# Patient Record
Sex: Female | Born: 1965 | Race: Black or African American | Hispanic: No | Smoking: Never smoker
Health system: Southern US, Community
[De-identification: ages and names within clinical notes are randomized; demographics above are authoritative.]

## PROBLEM LIST (undated history)

## (undated) ENCOUNTER — Emergency Department (HOSPITAL_COMMUNITY): Discharge status: Absent without leave | Payer: Self-pay

## (undated) DIAGNOSIS — D509 Iron deficiency anemia, unspecified: Secondary | ICD-10-CM

## (undated) HISTORY — PX: TUBAL LIGATION: SHX77

## (undated) HISTORY — PX: SMALL INTESTINE SURGERY: SHX150

## (undated) HISTORY — DX: Iron deficiency anemia, unspecified: D50.9

---

## 1999-01-18 ENCOUNTER — Inpatient Hospital Stay (HOSPITAL_COMMUNITY): Admission: AD | Admit: 1999-01-18 | Discharge: 1999-01-18 | Payer: Self-pay | Admitting: *Deleted

## 1999-03-19 ENCOUNTER — Inpatient Hospital Stay (HOSPITAL_COMMUNITY): Admission: AD | Admit: 1999-03-19 | Discharge: 1999-03-19 | Payer: Self-pay | Admitting: Obstetrics and Gynecology

## 1999-03-19 ENCOUNTER — Inpatient Hospital Stay (HOSPITAL_COMMUNITY): Admission: AD | Admit: 1999-03-19 | Discharge: 1999-03-21 | Payer: Self-pay | Admitting: Obstetrics and Gynecology

## 2003-12-03 HISTORY — PX: EXCISION OF TONGUE LESION: SHX6434

## 2010-12-18 ENCOUNTER — Emergency Department (HOSPITAL_COMMUNITY)
Admission: EM | Admit: 2010-12-18 | Discharge: 2010-12-18 | Payer: Self-pay | Source: Home / Self Care | Admitting: Family Medicine

## 2010-12-20 ENCOUNTER — Emergency Department (HOSPITAL_COMMUNITY)
Admission: EM | Admit: 2010-12-20 | Discharge: 2010-12-20 | Payer: Self-pay | Source: Home / Self Care | Admitting: Family Medicine

## 2015-10-16 ENCOUNTER — Emergency Department (HOSPITAL_BASED_OUTPATIENT_CLINIC_OR_DEPARTMENT_OTHER)
Admission: EM | Admit: 2015-10-16 | Discharge: 2015-10-16 | Disposition: A | Discharge status: Home / Self Care | Payer: Worker's Compensation | Attending: Emergency Medicine | Admitting: Emergency Medicine

## 2015-10-16 ENCOUNTER — Encounter (HOSPITAL_BASED_OUTPATIENT_CLINIC_OR_DEPARTMENT_OTHER): Payer: Self-pay | Admitting: Emergency Medicine

## 2015-10-16 DIAGNOSIS — Y92811 Bus as the place of occurrence of the external cause: Secondary | ICD-10-CM | POA: Diagnosis not present

## 2015-10-16 DIAGNOSIS — Y288XXA Contact with other sharp object, undetermined intent, initial encounter: Secondary | ICD-10-CM | POA: Insufficient documentation

## 2015-10-16 DIAGNOSIS — Z23 Encounter for immunization: Secondary | ICD-10-CM | POA: Insufficient documentation

## 2015-10-16 DIAGNOSIS — Y998 Other external cause status: Secondary | ICD-10-CM | POA: Insufficient documentation

## 2015-10-16 DIAGNOSIS — Y9389 Activity, other specified: Secondary | ICD-10-CM | POA: Insufficient documentation

## 2015-10-16 DIAGNOSIS — S80812A Abrasion, left lower leg, initial encounter: Secondary | ICD-10-CM | POA: Insufficient documentation

## 2015-10-16 DIAGNOSIS — T148XXA Other injury of unspecified body region, initial encounter: Secondary | ICD-10-CM

## 2015-10-16 MED ORDER — TETANUS-DIPHTH-ACELL PERTUSSIS 5-2.5-18.5 LF-MCG/0.5 IM SUSP
0.5000 mL | Freq: Once | INTRAMUSCULAR | Status: AC
  Administered 2015-10-16: 0.5 mL via INTRAMUSCULAR
  Filled 2015-10-16: qty 0.5

## 2015-10-16 NOTE — Discharge Instructions (Signed)
Continue wound care as we discussed. Forced wound daily with soap and water. Apply Neosporin or Polysporin ointment 2-3 times a day. Return for any developments of infection.
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
## 2015-11-19 ENCOUNTER — Encounter (HOSPITAL_BASED_OUTPATIENT_CLINIC_OR_DEPARTMENT_OTHER): Payer: Self-pay | Admitting: Emergency Medicine

## 2015-11-19 ENCOUNTER — Emergency Department (HOSPITAL_BASED_OUTPATIENT_CLINIC_OR_DEPARTMENT_OTHER)
Admission: EM | Admit: 2015-11-19 | Discharge: 2015-11-19 | Disposition: A | Discharge status: Home / Self Care | Payer: No Typology Code available for payment source | Attending: Emergency Medicine | Admitting: Emergency Medicine

## 2015-11-19 DIAGNOSIS — R110 Nausea: Secondary | ICD-10-CM | POA: Diagnosis present

## 2015-11-19 DIAGNOSIS — K529 Noninfective gastroenteritis and colitis, unspecified: Secondary | ICD-10-CM | POA: Diagnosis not present

## 2015-11-19 LAB — CBC WITH DIFFERENTIAL/PLATELET
Basophils Absolute: 0 10*3/uL (ref 0.0–0.1)
Basophils Relative: 0 %
Eosinophils Absolute: 0.1 10*3/uL (ref 0.0–0.7)
Eosinophils Relative: 2 %
HCT: 34.7 % — ABNORMAL LOW (ref 36.0–46.0)
Hemoglobin: 11.5 g/dL — ABNORMAL LOW (ref 12.0–15.0)
Lymphocytes Relative: 14 %
Lymphs Abs: 0.7 10*3/uL (ref 0.7–4.0)
MCH: 28.6 pg (ref 26.0–34.0)
MCHC: 33.1 g/dL (ref 30.0–36.0)
MCV: 86.3 fL (ref 78.0–100.0)
Monocytes Absolute: 0.3 10*3/uL (ref 0.1–1.0)
Monocytes Relative: 5 %
Neutro Abs: 3.9 10*3/uL (ref 1.7–7.7)
Neutrophils Relative %: 79 %
Platelets: 183 10*3/uL (ref 150–400)
RBC: 4.02 MIL/uL (ref 3.87–5.11)
RDW: 12.9 % (ref 11.5–15.5)
WBC: 5 10*3/uL (ref 4.0–10.5)

## 2015-11-19 LAB — BASIC METABOLIC PANEL
Anion gap: 9 (ref 5–15)
BUN: 7 mg/dL (ref 6–20)
CO2: 23 mmol/L (ref 22–32)
Calcium: 8.4 mg/dL — ABNORMAL LOW (ref 8.9–10.3)
Chloride: 105 mmol/L (ref 101–111)
Creatinine, Ser: 0.57 mg/dL (ref 0.44–1.00)
GFR calc Af Amer: >60 mL/min (ref >60)
GFR calc non Af Amer: >60 mL/min (ref >60)
Glucose, Bld: 86 mg/dL (ref 65–99)
Potassium: 3.1 mmol/L — ABNORMAL LOW (ref 3.5–5.1)
Sodium: 137 mmol/L (ref 135–145)

## 2015-11-19 MED ORDER — POTASSIUM CHLORIDE ER 10 MEQ PO TBCR: 10.0000 meq | EXTENDED_RELEASE_TABLET | Freq: Every day | ORAL | Status: DC

## 2015-11-19 MED ORDER — SODIUM CHLORIDE 0.9 % IV BOLUS (SEPSIS)
1000.0000 mL | Freq: Once | INTRAVENOUS | Status: AC
  Administered 2015-11-19: 1000 mL via INTRAVENOUS

## 2015-11-19 NOTE — Discharge Instructions (Signed)
Food Choices to Help Relieve Diarrhea, Adult °When you have diarrhea, the foods you eat and your eating habits are very important. Choosing the right foods and drinks can help relieve diarrhea. Also, because diarrhea can last up to 7 days, you need to replace lost fluids and electrolytes (such as sodium, potassium, and chloride) in order to help prevent dehydration.  °WHAT GENERAL GUIDELINES DO I NEED TO FOLLOW? °· Slowly drink 1 cup (8 oz) of fluid for each episode of diarrhea. If you are getting enough fluid, your urine will be clear or pale yellow. °· Eat starchy foods. Some good choices include white rice, white toast, pasta, low-fiber cereal, baked potatoes (without the skin), saltine crackers, and bagels. °· Avoid large servings of any cooked vegetables. °· Limit fruit to two servings per day. A serving is ½ cup or 1 small piece. °· Choose foods with less than 2 g of fiber per serving. °· Limit fats to less than 8 tsp (38 g) per day. °· Avoid fried foods. °· Eat foods that have probiotics in them. Probiotics can be found in certain dairy products. °· Avoid foods and beverages that may increase the speed at which food moves through the stomach and intestines (gastrointestinal tract). Things to avoid include: °¨ High-fiber foods, such as dried fruit, raw fruits and vegetables, nuts, seeds, and whole grain foods. °¨ Spicy foods and high-fat foods. °¨ Foods and beverages sweetened with high-fructose corn syrup, honey, or sugar alcohols such as xylitol, sorbitol, and mannitol. °WHAT FOODS ARE RECOMMENDED? °Grains °White rice. White, French, or pita breads (fresh or toasted), including plain rolls, buns, or bagels. White pasta. Saltine, soda, or graham crackers. Pretzels. Low-fiber cereal. Cooked cereals made with water (such as cornmeal, farina, or cream cereals). Plain muffins. Matzo. Melba toast. Zwieback.  °Vegetables °Potatoes (without the skin). Strained tomato and vegetable juices. Most well-cooked and canned  vegetables without seeds. Tender lettuce. °Fruits °Cooked or canned applesauce, apricots, cherries, fruit cocktail, grapefruit, peaches, pears, or plums. Fresh bananas, apples without skin, cherries, grapes, cantaloupe, grapefruit, peaches, oranges, or plums.  °Meat and Other Protein Products °Baked or boiled chicken. Eggs. Tofu. Fish. Seafood. Smooth peanut butter. Ground or well-cooked tender beef, ham, veal, lamb, pork, or poultry.  °Dairy °Plain yogurt, kefir, and unsweetened liquid yogurt. Lactose-free milk, buttermilk, or soy milk. Plain hard cheese. °Beverages °Sport drinks. Clear broths. Diluted fruit juices (except prune). Regular, caffeine-free sodas such as ginger ale. Water. Decaffeinated teas. Oral rehydration solutions. Sugar-free beverages not sweetened with sugar alcohols. °Other °Bouillon, broth, or soups made from recommended foods.  °The items listed above may not be a complete list of recommended foods or beverages. Contact your dietitian for more options. °WHAT FOODS ARE NOT RECOMMENDED? °Grains °Whole grain, whole wheat, bran, or rye breads, rolls, pastas, crackers, and cereals. Wild or brown rice. Cereals that contain more than 2 g of fiber per serving. Corn tortillas or taco shells. Cooked or dry oatmeal. Granola. Popcorn. °Vegetables °Raw vegetables. Cabbage, broccoli, Brussels sprouts, artichokes, baked beans, beet greens, corn, kale, legumes, peas, sweet potatoes, and yams. Potato skins. Cooked spinach and cabbage. °Fruits °Dried fruit, including raisins and dates. Raw fruits. Stewed or dried prunes. Fresh apples with skin, apricots, mangoes, pears, raspberries, and strawberries.  °Meat and Other Protein Products °Chunky peanut butter. Nuts and seeds. Beans and lentils. Bacon.  °Dairy °High-fat cheeses. Milk, chocolate milk, and beverages made with milk, such as milk shakes. Cream. Ice cream. °Sweets and Desserts °Sweet rolls, doughnuts, and sweet breads.   Pancakes and waffles. Fats and  Oils Butter. Cream sauces. Margarine. Salad oils. Plain salad dressings. Olives. Avocados.  Beverages Caffeinated beverages (such as coffee, tea, soda, or energy drinks). Alcoholic beverages. Fruit juices with pulp. Prune juice. Soft drinks sweetened with high-fructose corn syrup or sugar alcohols. Other Coconut. Hot sauce. Chili powder. Mayonnaise. Gravy. Cream-based or milk-based soups.  The items listed above may not be a complete list of foods and beverages to avoid. Contact your dietitian for more information. WHAT SHOULD I DO IF I BECOME DEHYDRATED? Diarrhea can sometimes lead to dehydration. Signs of dehydration include dark urine and dry mouth and skin. If you think you are dehydrated, you should rehydrate with an oral rehydration solution. These solutions can be purchased at pharmacies, retail stores, or online.  Drink -1 cup (120-240 mL) of oral rehydration solution each time you have an episode of diarrhea. If drinking this amount makes your diarrhea worse, try drinking smaller amounts more often. For example, drink 1-3 tsp (5-15 mL) every 5-10 minutes.  A general rule for staying hydrated is to drink 1-2 L of fluid per day. Talk to your health care provider about the specific amount you should be drinking each day. Drink enough fluids to keep your urine clear or pale yellow.   This information is not intended to replace advice given to you by your health care provider. Make sure you discuss any questions you have with your health care provider.   Document Released: 02/08/2004 Document Revised: 12/09/2014 Document Reviewed: 10/11/2013 Elsevier Interactive Patient Education 2016 [employer]  Please read attached information. If you experience any new or worsening signs or symptoms please return to the emergency room for evaluation. Please follow-up with your primary care provider or specialist as discussed. Please use medication prescribed only as directed and discontinue taking if  you have any concerning signs or symptoms.

## 2015-11-19 NOTE — ED Notes (Signed)
Patient states that she has had Abdominal pain and N/V/D after eating on Thursday.

## 2015-11-19 NOTE — ED Provider Notes (Signed)
CSN: [id number]     Arrival date & time 11/19/15  1452 History   First MD Initiated Contact with Patient 11/19/15 1504     Chief Complaint  Patient presents with  . Emesis   HPI   49 year old female presents today with nausea and abdominal cramping and diarrhea. Patient reports that symptoms started Friday morning around 3:30 AM as abdominal cramping and persistent watery diarrhea. Patient reports that she was feeling well the night before had eaten at a food truck around 5:00 that evening and had no complaints before going to bed. She reports that the diarrhea persisted throughout the day, blood or mucus noted. She reports yesterday she had 3-4 episodes of diarrhea and today has only had 2. She denies any fever, chills, focal abdominal pain or blood per rectum. Patient denies any close contacts with similar symptoms. No recent antibiotic exposure.   History reviewed. No pertinent past medical history. History reviewed. No pertinent past surgical history. History reviewed. No pertinent family history. Social History  Substance Use Topics  . Smoking status: Never Smoker   . Smokeless tobacco: None  . Alcohol Use: No   OB History    No data available     Review of Systems  All other systems reviewed and are negative.   Allergies  Review of patient's allergies indicates no known allergies.  Home Medications   Prior to Admission medications   Medication Sig Start Date End Date Taking? Authorizing Provider  potassium chloride (K-DUR) 10 MEQ tablet Take 1 tablet (10 mEq total) by mouth daily. 11/19/15   Eyvonne Mechanic, PA-C   BP 111/74 mmHg  Pulse 93  Temp(Src) 98.5 F (36.9 C) (Oral)  Resp 18  Ht  (1.575 m)  Wt 62.596 kg  BMI 25.23 kg/m2  SpO2 100%  LMP 11/19/2015   Physical Exam  Constitutional: She is oriented to person, place, and time. She appears well-developed and well-nourished.  HENT:  Head: Normocephalic and atraumatic.  Eyes: Conjunctivae are normal.  Pupils are equal, round, and reactive to light. Right eye exhibits no discharge. Left eye exhibits no discharge. No scleral icterus.  Neck: Normal range of motion. No JVD present. No tracheal deviation present.  Pulmonary/Chest: Effort normal. No stridor.  Abdominal: Soft. Bowel sounds are normal. She exhibits no distension and no mass. There is tenderness. There is no rebound and no guarding.  No focal abdominal tenderness, minorly tender to palpation throughout entire abdomen nonsurgical abdomen.  Neurological: She is alert and oriented to person, place, and time. Coordination normal.  Skin: Skin is warm and dry. No rash noted. No erythema. No pallor.  Psychiatric: She has a normal mood and affect. Her behavior is normal. Judgment and thought content normal.  Nursing note and vitals reviewed.     ED Course  Procedures (including critical care time) Labs Review Labs Reviewed  CBC WITH DIFFERENTIAL/PLATELET - Abnormal; Notable for the following:    Hemoglobin 11.5 (*)    HCT 34.7 (*)    All other components within normal limits  BASIC METABOLIC PANEL - Abnormal; Notable for the following:    Potassium 3.1 (*)    Calcium 8.4 (*)    All other components within normal limits    Imaging Review No results found. I have personally reviewed and evaluated these images and lab results as part of my medical decision-making.   EKG Interpretation None      MDM   Final diagnoses:  Gastroenteritis    Labs: CBC, BMP-  potassium of 3.1  Imaging:  Consults:  Therapeutics:  Discharge Meds:   Assessment/Plan: 49 year old female presents today with diarrhea. Patient has nausea, no vomiting, with only 2 episodes of diarrhea today, she has reassuring vital signs that show adequate volume status, and no significant laboratory abnormalities that would indicate significant electrolyte or fluid loss. Patient is afebrile, nontoxic with a relatively benign abdominal exam. No indication for  further management here in the ED. Today's presentation likely represents viral gastroenteritis, low suspicion for any food poisoning or significant GI infection. Patient will be instructed to eat bananas rice apples and toast, maintaining a bland diet adequate by mouth intake, follow up with her primary care provider in 2 days if symptoms persist, follow-up sooner as needed. Patient was given a short course of oral potassium. Patient verbalized understanding and agreement for today's plan and had no further questions or concerns.        Eyvonne MechanicJeffrey Aldean Pipe, PA-C 11/19/15 1623  Nelva Nayobert Beaton, MD 11/20/15 1245

## 2017-09-18 ENCOUNTER — Emergency Department (HOSPITAL_COMMUNITY)
Admission: EM | Admit: 2017-09-18 | Discharge: 2017-09-18 | Disposition: A | Discharge status: Home / Self Care | Payer: Worker's Compensation | Attending: Emergency Medicine | Admitting: Emergency Medicine

## 2017-09-18 ENCOUNTER — Emergency Department (HOSPITAL_COMMUNITY): Payer: Worker's Compensation

## 2017-09-18 DIAGNOSIS — S6991XA Unspecified injury of right wrist, hand and finger(s), initial encounter: Secondary | ICD-10-CM | POA: Diagnosis present

## 2017-09-18 DIAGNOSIS — Y990 Civilian activity done for income or pay: Secondary | ICD-10-CM | POA: Insufficient documentation

## 2017-09-18 DIAGNOSIS — Y9389 Activity, other specified: Secondary | ICD-10-CM | POA: Insufficient documentation

## 2017-09-18 DIAGNOSIS — S63501A Unspecified sprain of right wrist, initial encounter: Secondary | ICD-10-CM

## 2017-09-18 DIAGNOSIS — R9389 Abnormal findings on diagnostic imaging of other specified body structures: Secondary | ICD-10-CM | POA: Insufficient documentation

## 2017-09-18 DIAGNOSIS — Y92410 Unspecified street and highway as the place of occurrence of the external cause: Secondary | ICD-10-CM | POA: Diagnosis not present

## 2017-09-18 MED ORDER — FENTANYL CITRATE (PF) 100 MCG/2ML IJ SOLN
50.0000 ug | Freq: Once | INTRAMUSCULAR | Status: AC
  Administered 2017-09-18: 50 ug via INTRAVENOUS
  Filled 2017-09-18: qty 2
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
## 2017-09-18 NOTE — Progress Notes (Signed)
Orthopedic Tech Progress Note Patient Details:  Sydney Wiggins 1966-02-23 [id number]  Ortho Devices Type of Ortho Device: Velcro wrist splint Ortho Device/Splint Location: rue Ortho Device/Splint Interventions: Application   Sydney Wiggins 09/18/2017, 9:40 AM

## 2017-09-18 NOTE — Discharge Instructions (Signed)
Use right wrist splint as needed for pain Use Tylenol and ibuprofen and cold therapy as needed for pain Follow-up with your primary care doctor for referral regarding further imaging for cervical body abnormality seen on plain x-Sydney Wiggins Your anemia appears stable with hemoglobin of 11.5 Recheck with your company's occupational medicine doctor for further instructions regarding return to work

## 2017-09-25 ENCOUNTER — Other Ambulatory Visit (HOSPITAL_COMMUNITY): Payer: Self-pay | Admitting: Internal Medicine

## 2017-09-25 DIAGNOSIS — M542 Cervicalgia: Secondary | ICD-10-CM

## 2019-03-25 ENCOUNTER — Other Ambulatory Visit: Payer: Self-pay

## 2019-03-25 ENCOUNTER — Encounter: Payer: Self-pay | Admitting: Internal Medicine

## 2019-03-25 ENCOUNTER — Ambulatory Visit: Payer: BC Managed Care – PPO | Admitting: Internal Medicine

## 2019-03-25 VITALS — BP 112/74 | HR 88 | Temp 98.1°F | Ht 61.2 in | Wt 137.6 lb

## 2019-03-25 DIAGNOSIS — Z1211 Encounter for screening for malignant neoplasm of colon: Secondary | ICD-10-CM | POA: Diagnosis not present

## 2019-03-25 DIAGNOSIS — Z0000 Encounter for general adult medical examination without abnormal findings: Secondary | ICD-10-CM | POA: Diagnosis not present

## 2019-03-25 LAB — POCT URINALYSIS DIPSTICK
Bilirubin, UA: NEGATIVE
Blood, UA: NEGATIVE
Glucose, UA: NEGATIVE
Ketones, UA: NEGATIVE
Leukocytes, UA: NEGATIVE
Nitrite, UA: NEGATIVE
Protein, UA: NEGATIVE
Spec Grav, UA: 1.025 (ref 1.010–1.025)
Urobilinogen, UA: 0.2 E.U./dL
pH, UA: 5.5 (ref 5.0–8.0)

## 2019-03-25 NOTE — Patient Instructions (Signed)

## 2019-03-25 NOTE — Progress Notes (Signed)
Subjective:     Patient ID: Sydney Wiggins , female    DOB: 24-Apr-1966 , 53 y.o.   MRN: [id number]   Chief Complaint  Patient presents with  . Annual Exam    HPI  She is here today for a full physical examination.  She is followed by GYN for her pelvic exams. She has no specific concerns or complaints at this time.     History reviewed. No pertinent past medical history.   Family History  Problem Relation Age of Onset  . Healthy Mother   . Stroke Mother   . Prostate cancer Father   . Heart attack Father     No current outpatient medications on file.   No Known Allergies    The patient states she uses abstinence for birth control. Last LMP was No LMP recorded (lmp unknown).. Negative for Dysmenorrhea Negative for: breast discharge, breast lump(s), breast pain and breast self exam. Associated symptoms include abnormal vaginal bleeding. Pertinent negatives include abnormal bleeding (hematology), anxiety, decreased libido, depression, difficulty falling sleep, dyspareunia, history of infertility, nocturia, sexual dysfunction, sleep disturbances, urinary incontinence, urinary urgency, vaginal discharge and vaginal itching. Diet regular.The patient states her exercise level is  minimal.   . The patient's tobacco use is:  Social History   Tobacco Use  Smoking Status Never Smoker  Smokeless Tobacco Never Used  . She has been exposed to passive smoke. The patient's alcohol use is:  Social History   Substance and Sexual Activity  Alcohol Use No  . Additional information: Last pap Feb 2020.   Review of Systems  Constitutional: Negative.   HENT: Negative.   Eyes: Negative.   Respiratory: Negative.   Cardiovascular: Negative.   Gastrointestinal: Negative.   Endocrine: Negative.   Genitourinary: Negative.   Musculoskeletal: Negative.   Skin: Negative.   Allergic/Immunologic: Negative.   Neurological: Negative.   Hematological: Negative.   Psychiatric/Behavioral:  Negative.      Today's Vitals   03/25/19 0935  BP: 112/74  Pulse: 88  Temp: 98.1 F (36.7 C)  TempSrc: Oral  Weight: 137 lb 9.6 oz (62.4 kg)  Height: 5' 1.2" (1.554 m)   Body mass index is 25.83 kg/m.   Objective:  Physical Exam Vitals signs and nursing note reviewed.  Constitutional:      Appearance: Normal appearance.  HENT:     Head: Normocephalic and atraumatic.     Right Ear: Tympanic membrane, ear canal and external ear normal.     Left Ear: Tympanic membrane, ear canal and external ear normal.     Nose: Nose normal.     Mouth/Throat:     Mouth: Mucous membranes are moist.     Pharynx: Oropharynx is clear.  Eyes:     Extraocular Movements: Extraocular movements intact.     Conjunctiva/sclera: Conjunctivae normal.     Pupils: Pupils are equal, round, and reactive to light.  Neck:     Musculoskeletal: Normal range of motion and neck supple.  Cardiovascular:     Rate and Rhythm: Normal rate and regular rhythm.     Pulses: Normal pulses.     Heart sounds: Normal heart sounds.  Pulmonary:     Effort: Pulmonary effort is normal.     Breath sounds: Normal breath sounds.  Chest:     Breasts:        Right: Normal. No swelling, bleeding, inverted nipple, mass or nipple discharge.        Left: Normal. No swelling, bleeding, inverted  nipple, mass or nipple discharge.  Abdominal:     General: Abdomen is flat. Bowel sounds are normal.     Palpations: Abdomen is soft.  Genitourinary:    Comments: deferred Musculoskeletal: Normal range of motion.  Skin:    General: Skin is warm and dry.  Neurological:     General: No focal deficit present.     Mental Status: She is alert and oriented to person, place, and time.  Psychiatric:        Mood and Affect: Mood normal.        Behavior: Behavior normal.   x      Assessment And Plan:     1. Routine general medical examination at health care facility  A full exam was performed.  Importance of monthly self breast exams  was discussed with the patient.  PATIENT HAS BEEN ADVISED TO GET 30-45 MINUTES REGULAR EXERCISE NO LESS THAN FOUR TO FIVE DAYS PER WEEK - BOTH WEIGHTBEARING EXERCISES AND AEROBIC ARE RECOMMENDED.  SHE IS ADVISED TO FOLLOW A HEALTHY DIET WITH AT LEAST SIX FRUITS/VEGGIES PER DAY, DECREASE INTAKE OF RED MEAT, AND TO INCREASE FISH INTAKE TO TWO DAYS PER WEEK.  MEATS/FISH SHOULD NOT BE FRIED, BAKED OR BROILED IS PREFERABLE.  I SUGGEST WEARING SPF 50 SUNSCREEN ON EXPOSED PARTS AND ESPECIALLY WHEN IN THE DIRECT SUNLIGHT FOR AN EXTENDED PERIOD OF TIME.  PLEASE AVOID FAST FOOD RESTAURANTS AND INCREASE YOUR WATER INTAKE.  - HIV antibody (with reflex) - CBC - Iron and IBC ([id number]) - Ferritin - POCT Urinalysis Dipstick (81002)  2. Screen for colon cancer  I will refer her to GI for CRC screening. This was done last year as well, but was not scheduled b/c she thought she had a copay. She recently found out that she does not have a copay.   - Ambulatory referral to Gastroenterology        Gwynneth Aliment, MD    THE PATIENT IS ENCOURAGED TO PRACTICE SOCIAL DISTANCING DUE TO THE COVID-19 PANDEMIC.

## 2019-03-26 ENCOUNTER — Telehealth: Payer: Self-pay

## 2019-03-26 LAB — IRON AND TIBC
Iron Saturation: 13 % — ABNORMAL LOW (ref 15–55)
Iron: 56 ug/dL (ref 27–159)
Total Iron Binding Capacity: 434 ug/dL (ref 250–450)
UIBC: 378 ug/dL (ref 131–425)

## 2019-03-26 LAB — CBC
Hematocrit: 32.7 % — ABNORMAL LOW (ref 34.0–46.6)
Hemoglobin: 10.9 g/dL — ABNORMAL LOW (ref 11.1–15.9)
MCH: 28.6 pg (ref 26.6–33.0)
MCHC: 33.3 g/dL (ref 31.5–35.7)
MCV: 86 fL (ref 79–97)
Platelets: 181 10*3/uL (ref 150–450)
RBC: 3.81 x10E6/uL (ref 3.77–5.28)
RDW: 13.3 % (ref 11.7–15.4)
WBC: 2.8 10*3/uL — ABNORMAL LOW (ref 3.4–10.8)

## 2019-03-26 LAB — HIV ANTIBODY (ROUTINE TESTING W REFLEX): HIV Screen 4th Generation wRfx: NONREACTIVE

## 2019-03-26 LAB — FERRITIN: Ferritin: 24 ng/mL (ref 15–150)

## 2019-03-26 NOTE — Telephone Encounter (Signed)
Left the patient a message to call back for lab results. 

## 2019-04-05 ENCOUNTER — Telehealth: Payer: Self-pay

## 2019-04-05 NOTE — Telephone Encounter (Signed)
HIV negative. Blood count is slightly low. Iron levels are low. Are you taking iron? If no, okay to get samples of Fusion Plus. Please incorporate more exercise into your daily routine as we discussed during your visit. Please let me know if you have any questions   LEFT PT V/M TO CALL OFFICE. Sydney Wiggins

## 2019-04-14 ENCOUNTER — Other Ambulatory Visit: Payer: Self-pay

## 2019-04-22 LAB — HM COLONOSCOPY

## 2019-06-11 ENCOUNTER — Telehealth: Payer: Self-pay
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
## 2019-06-16 ENCOUNTER — Encounter: Payer: Self-pay | Admitting: Internal Medicine

## 2019-06-29 ENCOUNTER — Telehealth: Payer: Self-pay

## 2019-06-29 NOTE — Telephone Encounter (Signed)
Spoke with patient about paperwork that she is needing to have filled out for disability. Called pt back to let her know that she needed to be seen so we could get an update of where she is physically. I tried to set up appt with pt but she got upset and stated that she needed to talk with her lawyer first. I explained to the pt that before we could not fill out paperwork until she was seen and pt asked was it anyone else she could speak to. Pt spoke with k williams.

## 2019-07-29 ENCOUNTER — Ambulatory Visit: Payer: BC Managed Care – PPO | Admitting: Internal Medicine

## 2019-10-14 ENCOUNTER — Ambulatory Visit: Payer: Self-pay | Admitting: Internal Medicine

## 2019-10-14 ENCOUNTER — Encounter: Payer: Self-pay | Admitting: Internal Medicine

## 2019-10-14 ENCOUNTER — Other Ambulatory Visit: Payer: Self-pay

## 2019-10-14 VITALS — BP 118/66 | HR 83 | Temp 98.2°F | Ht 61.2 in | Wt 147.2 lb

## 2019-10-14 DIAGNOSIS — Z6827 Body mass index (BMI) 27.0-27.9, adult: Secondary | ICD-10-CM

## 2019-10-14 DIAGNOSIS — D508 Other iron deficiency anemias: Secondary | ICD-10-CM | POA: Diagnosis not present

## 2019-10-14 DIAGNOSIS — R7309 Other abnormal glucose: Secondary | ICD-10-CM

## 2019-10-14 DIAGNOSIS — E663 Overweight: Secondary | ICD-10-CM

## 2019-10-14 DIAGNOSIS — E7800 Pure hypercholesterolemia, unspecified: Secondary | ICD-10-CM

## 2019-10-14 DIAGNOSIS — H9203 Otalgia, bilateral: Secondary | ICD-10-CM

## 2019-10-14 NOTE — Patient Instructions (Signed)

## 2019-10-15 ENCOUNTER — Encounter: Payer: Self-pay | Admitting: Internal Medicine

## 2019-10-15 LAB — FERRITIN: Ferritin: 34 ng/mL (ref 15–150)

## 2019-10-15 LAB — CBC
Hematocrit: 34.5 % (ref 34.0–46.6)
Hemoglobin: 11.7 g/dL (ref 11.1–15.9)
MCH: 29.5 pg (ref 26.6–33.0)
MCHC: 33.9 g/dL (ref 31.5–35.7)
MCV: 87 fL (ref 79–97)
Platelets: 173 10*3/uL (ref 150–450)
RBC: 3.96 x10E6/uL (ref 3.77–5.28)
RDW: 12.8 % (ref 11.7–15.4)
WBC: 3.3 10*3/uL — ABNORMAL LOW (ref 3.4–10.8)

## 2019-10-15 LAB — HEMOGLOBIN A1C
Est. average glucose Bld gHb Est-mCnc: 114 mg/dL
Hgb A1c MFr Bld: 5.6 % (ref 4.8–5.6)

## 2019-10-15 LAB — LIPID PANEL
Chol/HDL Ratio: 4.4 ratio (ref 0.0–4.4)
Cholesterol, Total: 240 mg/dL — ABNORMAL HIGH (ref 100–199)
HDL: 55 mg/dL (ref >39)
LDL Chol Calc (NIH): 171 mg/dL — ABNORMAL HIGH (ref 0–99)
Triglycerides: 82 mg/dL (ref 0–149)
VLDL Cholesterol Cal: 14 mg/dL (ref 5–40)

## 2019-10-15 LAB — IRON AND TIBC
Iron Saturation: 19 % (ref 15–55)
Iron: 84 ug/dL (ref 27–159)
Total Iron Binding Capacity: 446 ug/dL (ref 250–450)
UIBC: 362 ug/dL (ref 131–425)

## 2019-10-31 NOTE — Progress Notes (Signed)
Subjective:     Patient ID: Sydney Wiggins , female    DOB: 11-01-1966 , 53 y.o.   MRN: [id number]   Chief Complaint  Patient presents with  . prediabetes  . Iron level  . Hyperlipidemia    HPI  She is here today for f/u prediabetes and high cholesterol.  She is not taking medication for either condition. She is hoping that lifestyle changes will help to address both issues. She does not wish to take rx meds if possible. She admits she is not yet exercising regularly. Additionally, she wants to have her iron levels checked as well. She does not feel fatigued.   Hyperlipidemia This is a chronic problem. The current episode started more than 1 year ago. The problem is uncontrolled. Pertinent negatives include no chest pain, focal sensory loss or shortness of breath. She is currently on no antihyperlipidemic treatment. Risk factors for coronary artery disease include dyslipidemia.     History reviewed. No pertinent past medical history.   Family History  Problem Relation Age of Onset  . Healthy Mother   . Stroke Mother   . Prostate cancer Father   . Heart attack Father      Current Outpatient Medications:  Marland Kitchen  MAGNESIUM PO, Take by mouth. 435 mg daily, Disp: , Rfl:    No Known Allergies   Review of Systems  Constitutional: Negative.   HENT: Positive for ear pain.        She c/o b/l ear pain. Described as throbbing pain. Denies fever/chills. Denies trauma. No drainage from ear. No hearing deficit.   Respiratory: Negative.  Negative for shortness of breath.   Cardiovascular: Negative.  Negative for chest pain.  Gastrointestinal: Negative.   Neurological: Negative.   Psychiatric/Behavioral: Negative.      Today's Vitals   10/14/19 1457  BP: 118/66  Pulse: 83  Temp: 98.2 F (36.8 C)  TempSrc: Oral  Weight: 147 lb 3.2 oz (66.8 kg)  Height: 5' 1.2" (1.554 m)   Body mass index is 27.63 kg/m.   Objective:  Physical Exam Vitals signs and nursing note reviewed.   Constitutional:      Appearance: Normal appearance.  HENT:     Head: Normocephalic and atraumatic.     Right Ear: Tympanic membrane, ear canal and external ear normal. There is no impacted cerumen.     Left Ear: Tympanic membrane, ear canal and external ear normal. There is no impacted cerumen.  Cardiovascular:     Rate and Rhythm: Normal rate and regular rhythm.     Heart sounds: Normal heart sounds.  Pulmonary:     Effort: Pulmonary effort is normal.     Breath sounds: Normal breath sounds.  Skin:    General: Skin is warm.  Neurological:     General: No focal deficit present.     Mental Status: She is alert.  Psychiatric:        Mood and Affect: Mood normal.        Behavior: Behavior normal.         Assessment And Plan:     1. Other abnormal glucose  HER A1C HAS BEEN ELEVATED IN THE PAST. I WILL CHECK AN A1C, BMET TODAY. SHE WAS ENCOURAGED TO AVOID SUGARY BEVERAGES AND PROCESSED FOODS INCLUDNG BREADS, RICE AND PASTA.  - Hemoglobin A1c  2. Other iron deficiency anemia  I will check labs as listed below. I will make further recommendations once her labs are available for review.   -  CBC no Diff - Iron and IBC ([id number]) - Ferritin  3. Pure hypercholesterolemia  I will check fasting lipid panel. She is encouraged to avoid fried foods, increase physical activity - aiming for 30 minutes five days per week, and to increase her fiber intake.   - Lipid panel  4. Otalgia of both ears  Nothing concerning found on exam. Pt advised she may benefit from decongestant, I.e. phenylephrine.  She will let me know if her sx persist. If so, I will consider prescribing short course of Norel and/or ENT evaluation. She is in agreement to treatment plan.   5. Overweight with body mass index (BMI) of 27 to 27.9 in adult  She is encouraged to aim for BMI less than 25 to decrease cardiac risk. Again, importance of regular exercise was discussed with the patient.   Gwynneth Aliment, MD    THE PATIENT IS ENCOURAGED TO PRACTICE SOCIAL DISTANCING DUE TO THE COVID-19 PANDEMIC.

## 2020-02-02 DIAGNOSIS — N926 Irregular menstruation, unspecified: Secondary | ICD-10-CM | POA: Diagnosis not present

## 2020-02-02 DIAGNOSIS — Z9851 Tubal ligation status: Secondary | ICD-10-CM | POA: Diagnosis not present

## 2020-02-02 DIAGNOSIS — D250 Submucous leiomyoma of uterus: Secondary | ICD-10-CM | POA: Diagnosis not present

## 2020-02-02 DIAGNOSIS — Z01419 Encounter for gynecological examination (general) (routine) without abnormal findings: Secondary | ICD-10-CM | POA: Diagnosis not present

## 2020-02-02 DIAGNOSIS — Z1151 Encounter for screening for human papillomavirus (HPV): Secondary | ICD-10-CM | POA: Diagnosis not present

## 2020-02-02 LAB — HM PAP SMEAR

## 2020-03-16 DIAGNOSIS — Z1231 Encounter for screening mammogram for malignant neoplasm of breast: Secondary | ICD-10-CM | POA: Diagnosis not present

## 2020-03-16 LAB — HM MAMMOGRAPHY: HM Mammogram: NORMAL (ref 0–4)

## 2020-03-30 ENCOUNTER — Ambulatory Visit (INDEPENDENT_AMBULATORY_CARE_PROVIDER_SITE_OTHER): Payer: BC Managed Care – PPO | Admitting: Internal Medicine

## 2020-03-30 ENCOUNTER — Encounter: Payer: Self-pay | Admitting: Internal Medicine

## 2020-03-30 ENCOUNTER — Other Ambulatory Visit: Payer: Self-pay

## 2020-03-30 VITALS — BP 108/66 | HR 84 | Temp 98.1°F | Ht 62.0 in | Wt 144.2 lb

## 2020-03-30 DIAGNOSIS — Z712 Person consulting for explanation of examination or test findings: Secondary | ICD-10-CM

## 2020-03-30 DIAGNOSIS — Z0000 Encounter for general adult medical examination without abnormal findings: Secondary | ICD-10-CM

## 2020-03-30 DIAGNOSIS — E7800 Pure hypercholesterolemia, unspecified: Secondary | ICD-10-CM | POA: Diagnosis not present

## 2020-03-30 LAB — POCT URINALYSIS DIPSTICK
Bilirubin, UA: NEGATIVE
Blood, UA: NEGATIVE
Glucose, UA: NEGATIVE
Ketones, UA: NEGATIVE
Leukocytes, UA: NEGATIVE
Nitrite, UA: NEGATIVE
Protein, UA: NEGATIVE
Spec Grav, UA: 1.025 (ref 1.010–1.025)
Urobilinogen, UA: 0.2 E.U./dL
pH, UA: 6 (ref 5.0–8.0)

## 2020-03-30 NOTE — Patient Instructions (Signed)
Health Maintenance, Female Adopting a healthy lifestyle and getting preventive care are important in promoting health and wellness. Ask your health care provider about:  The right schedule for you to have regular tests and exams.  Things you can do on your own to prevent diseases and keep yourself healthy. What should I know about diet, weight, and exercise? Eat a healthy diet   Eat a diet that includes plenty of vegetables, fruits, low-fat dairy products, and lean protein.  Do not eat a lot of foods that are high in solid fats, added sugars, or sodium. Maintain a healthy weight Body mass index (BMI) is used to identify weight problems. It estimates body fat based on height and weight. Your health care provider can help determine your BMI and help you achieve or maintain a healthy weight. Get regular exercise Get regular exercise. This is one of the most important things you can do for your health. Most adults should:  Exercise for at least 150 minutes each week. The exercise should increase your heart rate and make you sweat (moderate-intensity exercise).  Do strengthening exercises at least twice a week. This is in addition to the moderate-intensity exercise.  Spend less time sitting. Even light physical activity can be beneficial. Watch cholesterol and blood lipids Have your blood tested for lipids and cholesterol at 52 years of age, then have this test every 5 years. Have your cholesterol levels checked more often if:  Your lipid or cholesterol levels are high.  You are older than 52 years of age.  You are at high risk for heart disease. What should I know about cancer screening? Depending on your health history and family history, you may need to have cancer screening at various ages. This may include screening for:  Breast cancer.  Cervical cancer.  Colorectal cancer.  Skin cancer.  Lung cancer. What should I know about heart disease, diabetes, and high blood  pressure? Blood pressure and heart disease  High blood pressure causes heart disease and increases the risk of stroke. This is more likely to develop in people who have high blood pressure readings, are of African descent, or are overweight.  Have your blood pressure checked: ? Every 3-5 years if you are 18-39 years of age. ? Every year if you are 40 years old or older. Diabetes Have regular diabetes screenings. This checks your fasting blood sugar level. Have the screening done:  Once every three years after age 40 if you are at a normal weight and have a low risk for diabetes.  More often and at a younger age if you are overweight or have a high risk for diabetes. What should I know about preventing infection? Hepatitis B If you have a higher risk for hepatitis B, you should be screened for this virus. Talk with your health care provider to find out if you are at risk for hepatitis B infection. Hepatitis C Testing is recommended for:  Everyone born from 1945 through 1965.  Anyone with known risk factors for hepatitis C. Sexually transmitted infections (STIs)  Get screened for STIs, including gonorrhea and chlamydia, if: ? You are sexually active and are younger than 52 years of age. ? You are older than 52 years of age and your health care provider tells you that you are at risk for this type of infection. ? Your sexual activity has changed since you were last screened, and you are at increased risk for chlamydia or gonorrhea. Ask your health care provider if   you are at risk.  Ask your health care provider about whether you are at high risk for HIV. Your health care provider may recommend a prescription medicine to help prevent HIV infection. If you choose to take medicine to prevent HIV, you should first get tested for HIV. You should then be tested every 3 months for as long as you are taking the medicine. Pregnancy  If you are about to stop having your period (premenopausal) and  you may become pregnant, seek counseling before you get pregnant.  Take 400 to 800 micrograms (mcg) of folic acid every day if you become pregnant.  Ask for birth control (contraception) if you want to prevent pregnancy. Osteoporosis and menopause Osteoporosis is a disease in which the bones lose minerals and strength with aging. This can result in bone fractures. If you are 65 years old or older, or if you are at risk for osteoporosis and fractures, ask your health care provider if you should:  Be screened for bone loss.  Take a calcium or vitamin D supplement to lower your risk of fractures.  Be given hormone replacement therapy (HRT) to treat symptoms of menopause. Follow these instructions at home: Lifestyle  Do not use any products that contain nicotine or tobacco, such as cigarettes, e-cigarettes, and chewing tobacco. If you need help quitting, ask your health care provider.  Do not use street drugs.  Do not share needles.  Ask your health care provider for help if you need support or information about quitting drugs. Alcohol use  Do not drink alcohol if: ? Your health care provider tells you not to drink. ? You are pregnant, may be pregnant, or are planning to become pregnant.  If you drink alcohol: ? Limit how much you use to 0-1 drink a day. ? Limit intake if you are breastfeeding.  Be aware of how much alcohol is in your drink. In the [place] one drink equals one 12 oz bottle of beer (355 mL), one 5 oz glass of wine (148 mL), or one 1 oz glass of hard liquor (44 mL). General instructions  Schedule regular health, dental, and eye exams.  Stay current with your vaccines.  Tell your health care provider if: ? You often feel depressed. ? You have ever been abused or do not feel safe at home. Summary  Adopting a healthy lifestyle and getting preventive care are important in promoting health and wellness.  Follow your health care provider's instructions about healthy  diet, exercising, and getting tested or screened for diseases.  Follow your health care provider's instructions on monitoring your cholesterol and blood pressure. This information is not intended to replace advice given to you by your health care provider. Make sure you discuss any questions you have with your health care provider. Document Revised: 11/11/2018 Document Reviewed: 11/11/2018 Elsevier Patient Education  2020 [employer]  

## 2020-03-30 NOTE — Progress Notes (Signed)
This visit occurred during the SARS-CoV-2 public health emergency.  Safety protocols were in place, including screening questions prior to the visit, additional usage of staff PPE, and extensive cleaning of exam room while observing appropriate contact time as indicated for disinfecting solutions.  Subjective:     Patient ID: Sydney Wiggins , female    DOB: 1966/04/04 , 54 y.o.   MRN: [id number]   Chief Complaint  Patient presents with  . Annual Exam    HPI  She is here today for a full physical examination.  She is followed by Dr. Charlyne Petrin for her GYN exams.  She was last seen in [place] 2021.     No past medical history on file.   Family History  Problem Relation Age of Onset  . Healthy Mother   . Stroke Mother   . Prostate cancer Father   . Heart attack Father      Current Outpatient Medications:  Marland Kitchen  MAGNESIUM PO, Take by mouth. 435 mg daily, Disp: , Rfl:    No Known Allergies    The patient states she uses none for birth control. Last LMP was No LMP recorded (lmp unknown). Patient is postmenopausal.. Negative for Dysmenorrhea  Negative for: breast discharge, breast lump(s), breast pain and breast self exam. Associated symptoms include abnormal vaginal bleeding. Pertinent negatives include abnormal bleeding (hematology), anxiety, decreased libido, depression, difficulty falling sleep, dyspareunia, history of infertility, nocturia, sexual dysfunction, sleep disturbances, urinary incontinence, urinary urgency, vaginal discharge and vaginal itching. Diet regular.The patient states her exercise level is  moderate.  . The patient's tobacco use is:  Social History   Tobacco Use  Smoking Status Never Smoker  Smokeless Tobacco Never Used  . She has been exposed to passive smoke. The patient's alcohol use is:  Social History   Substance and Sexual Activity  Alcohol Use No    Review of Systems  Constitutional: Negative.   HENT: Negative.   Eyes: Negative.    Respiratory: Negative.   Cardiovascular: Negative.   Endocrine: Negative.   Genitourinary: Negative.   Musculoskeletal: Negative.   Skin: Negative.   Allergic/Immunologic: Negative.   Neurological: Negative.   Hematological: Negative.   Psychiatric/Behavioral: Negative.      Today's Vitals   03/30/20 0852  BP: 108/66  Pulse: 84  Temp: 98.1 F (36.7 C)  TempSrc: Oral  Weight: 144 lb 3.2 oz (65.4 kg)  Height: '5\' 2"'  (1.575 m)   Body mass index is 26.37 kg/m.   Objective:  Physical Exam Vitals and nursing note reviewed.  Constitutional:      Appearance: Normal appearance.  HENT:     Head: Normocephalic and atraumatic.     Right Ear: Tympanic membrane, ear canal and external ear normal.     Left Ear: Tympanic membrane, ear canal and external ear normal.     Nose:     Comments: Deferred, masked    Mouth/Throat:     Comments: Deferred, masked Eyes:     Extraocular Movements: Extraocular movements intact.     Conjunctiva/sclera: Conjunctivae normal.     Pupils: Pupils are equal, round, and reactive to light.  Cardiovascular:     Rate and Rhythm: Normal rate and regular rhythm.     Pulses: Normal pulses.     Heart sounds: Normal heart sounds.  Pulmonary:     Effort: Pulmonary effort is normal.     Breath sounds: Normal breath sounds.  Chest:     Breasts: Tanner Score is 5.  Right: Normal.        Left: Normal.  Abdominal:     General: Abdomen is flat. Bowel sounds are normal.     Palpations: Abdomen is soft.  Genitourinary:    Comments: deferred Musculoskeletal:        General: Normal range of motion.     Cervical back: Normal range of motion and neck supple.  Skin:    General: Skin is warm and dry.  Neurological:     General: No focal deficit present.     Mental Status: She is alert and oriented to person, place, and time.  Psychiatric:        Mood and Affect: Mood normal.        Behavior: Behavior normal.         Assessment And Plan:     1.  Routine general medical examination at health care facility  A full exam was performed.  Importance of monthly self breast exams was discussed with the patient. I will check labs as listed below. She declines COVID vaccine. PATIENT IS ADVISED TO GET 30-45 MINUTES REGULAR EXERCISE NO LESS THAN FOUR TO FIVE DAYS PER WEEK - BOTH WEIGHTBEARING EXERCISES AND AEROBIC ARE RECOMMENDED.  SHE IS ADVISED TO FOLLOW A HEALTHY DIET WITH AT LEAST SIX FRUITS/VEGGIES PER DAY, DECREASE INTAKE OF RED MEAT, AND TO INCREASE FISH INTAKE TO TWO DAYS PER WEEK.  MEATS/FISH SHOULD NOT BE FRIED, BAKED OR BROILED IS PREFERABLE.  I SUGGEST WEARING SPF 50 SUNSCREEN ON EXPOSED PARTS AND ESPECIALLY WHEN IN THE DIRECT SUNLIGHT FOR AN EXTENDED PERIOD OF TIME.  PLEASE AVOID FAST FOOD RESTAURANTS AND INCREASE YOUR WATER INTAKE.  - CMP14+EGFR - CBC - Lipid panel - TSH - Hemoglobin A1c  2. Pure hypercholesterolemia  Chronic. Previous lab results from Nov 2020 were reviewed in full detail. LDL was 171 in November 2020. She is now taking a fish oil supplement. She also reports her Gyn states that her chol is "ok" and there was no cause for concern. Pt advised that goal LDL is less than 130. She reports making dietary changes since her last visit. I will check lipid panel today and make further suggestions once results are available. She was congratulated on her lifestyle changes.    Maximino Greenland, MD    THE PATIENT IS ENCOURAGED TO PRACTICE SOCIAL DISTANCING DUE TO THE COVID-19 PANDEMIC.

## 2020-03-31 LAB — CMP14+EGFR
ALT: 9 IU/L (ref 0–32)
AST: 20 IU/L (ref 0–40)
Albumin/Globulin Ratio: 1.8 (ref 1.2–2.2)
Albumin: 4.4 g/dL (ref 3.8–4.9)
Alkaline Phosphatase: 64 IU/L (ref 39–117)
BUN/Creatinine Ratio: 15 (ref 9–23)
BUN: 11 mg/dL (ref 6–24)
Bilirubin Total: 0.8 mg/dL (ref 0.0–1.2)
CO2: 21 mmol/L (ref 20–29)
Calcium: 9 mg/dL (ref 8.7–10.2)
Chloride: 103 mmol/L (ref 96–106)
Creatinine, Ser: 0.73 mg/dL (ref 0.57–1.00)
GFR calc Af Amer: 110 mL/min/{1.73_m2} (ref >59)
GFR calc non Af Amer: 95 mL/min/{1.73_m2} (ref >59)
Globulin, Total: 2.5 g/dL (ref 1.5–4.5)
Glucose: 93 mg/dL (ref 65–99)
Potassium: 4.1 mmol/L (ref 3.5–5.2)
Sodium: 138 mmol/L (ref 134–144)
Total Protein: 6.9 g/dL (ref 6.0–8.5)

## 2020-03-31 LAB — CBC
Hematocrit: 33.6 % — ABNORMAL LOW (ref 34.0–46.6)
Hemoglobin: 10.8 g/dL — ABNORMAL LOW (ref 11.1–15.9)
MCH: 28.3 pg (ref 26.6–33.0)
MCHC: 32.1 g/dL (ref 31.5–35.7)
MCV: 88 fL (ref 79–97)
Platelets: 175 10*3/uL (ref 150–450)
RBC: 3.81 x10E6/uL (ref 3.77–5.28)
RDW: 13.4 % (ref 11.7–15.4)
WBC: 3.4 10*3/uL (ref 3.4–10.8)

## 2020-03-31 LAB — LIPID PANEL
Chol/HDL Ratio: 3.9 ratio (ref 0.0–4.4)
Cholesterol, Total: 205 mg/dL — ABNORMAL HIGH (ref 100–199)
HDL: 52 mg/dL (ref >39)
LDL Chol Calc (NIH): 136 mg/dL — ABNORMAL HIGH (ref 0–99)
Triglycerides: 96 mg/dL (ref 0–149)
VLDL Cholesterol Cal: 17 mg/dL (ref 5–40)

## 2020-03-31 LAB — TSH: TSH: 0.992 u[IU]/mL (ref 0.450–4.500)

## 2020-03-31 LAB — HEMOGLOBIN A1C
Est. average glucose Bld gHb Est-mCnc: 111 mg/dL
Hgb A1c MFr Bld: 5.5 % (ref 4.8–5.6)

## 2020-09-29 DIAGNOSIS — Z20822 Contact with and (suspected) exposure to covid-19: Secondary | ICD-10-CM | POA: Diagnosis not present

## 2020-10-19 DIAGNOSIS — M79661 Pain in right lower leg: Secondary | ICD-10-CM | POA: Diagnosis not present

## 2020-10-19 DIAGNOSIS — Z20822 Contact with and (suspected) exposure to covid-19: Secondary | ICD-10-CM | POA: Diagnosis not present

## 2020-10-19 DIAGNOSIS — S99911A Unspecified injury of right ankle, initial encounter: Secondary | ICD-10-CM | POA: Diagnosis not present

## 2020-10-19 DIAGNOSIS — M79604 Pain in right leg: Secondary | ICD-10-CM | POA: Diagnosis not present

## 2020-10-19 DIAGNOSIS — S8991XA Unspecified injury of right lower leg, initial encounter: Secondary | ICD-10-CM | POA: Diagnosis not present

## 2020-10-19 DIAGNOSIS — M25571 Pain in right ankle and joints of right foot: Secondary | ICD-10-CM | POA: Diagnosis not present

## 2020-10-23 ENCOUNTER — Telehealth: Payer: Self-pay

## 2020-11-08 ENCOUNTER — Ambulatory Visit (INDEPENDENT_AMBULATORY_CARE_PROVIDER_SITE_OTHER): Payer: BC Managed Care – PPO | Admitting: Nurse Practitioner

## 2020-11-08 ENCOUNTER — Encounter: Payer: Self-pay | Admitting: Nurse Practitioner

## 2020-11-08 ENCOUNTER — Other Ambulatory Visit: Payer: Self-pay

## 2020-11-08 VITALS — BP 120/70 | HR 78 | Temp 98.6°F | Ht 61.8 in | Wt 136.2 lb

## 2020-11-08 DIAGNOSIS — R200 Anesthesia of skin: Secondary | ICD-10-CM | POA: Diagnosis not present

## 2020-11-08 DIAGNOSIS — R202 Paresthesia of skin: Secondary | ICD-10-CM
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
## 2020-11-08 NOTE — Patient Instructions (Signed)
Hand Pain Many things can cause hand pain. Some common causes are:  An injury.  Repeating the same movement with your hand over and over (overuse).  Osteoporosis.  Arthritis.  Lumps in the tendons or joints of the hand and wrist (ganglion cysts).  Nerve compression syndromes (carpal tunnel syndrome).  Inflammation of the tendons (tendinitis).  Infection. Follow these instructions at home: Pay attention to any changes in your symptoms. Take these actions to help with your discomfort: Managing pain, stiffness, and swelling   Take over-the-counter and prescription medicines only as told by your health care provider.  Wear a hand splint or support as told by your health care provider.  If directed, put ice on the affected area: ? Put ice in a plastic bag. ? Place a towel between your skin and the bag. ? Leave the ice on for 20 minutes, 2-3 times a day. Activity  Take breaks from repetitive activity often.  Avoid activities that make your pain worse.  Minimize stress on your hands and wrists as much as possible.  Do stretches or exercises as told by your health care provider.  Do not do activities that make your pain worse. Contact a health care provider if:  Your pain does not get better after a few days of self-care.  Your pain gets worse.  Your pain affects your ability to do your daily activities. Get help right away if:  Your hand becomes warm, red, or swollen.  Your hand is numb or tingling.  Your hand is extremely swollen or deformed.  Your hand or fingers turn white or blue.  You cannot move your hand, wrist, or fingers. Summary  Many things can cause hand pain.  Contact your health care provider if your pain does not get better after a few days of self care.  Minimize stress on your hands and wrists as much as possible.  Do not do activities that make your pain worse. This information is not intended to replace advice given to you by your  health care provider. Make sure you discuss any questions you have with your health care provider. Document Revised: 08/14/2018 Document Reviewed: 08/14/2018 Elsevier Patient Education  2020 [employer]  

## 2020-11-09 ENCOUNTER — Other Ambulatory Visit: Payer: Self-pay | Admitting: Nurse Practitioner

## 2020-11-09 ENCOUNTER — Ambulatory Visit
Admission: RE | Admit: 2020-11-09 | Discharge: 2020-11-09 | Disposition: A | Discharge status: Home / Self Care | Payer: Worker's Compensation | Source: Ambulatory Visit | Attending: Nurse Practitioner | Admitting: Nurse Practitioner

## 2020-11-09 DIAGNOSIS — R200 Anesthesia of skin: Secondary | ICD-10-CM

## 2020-11-09 DIAGNOSIS — R202 Paresthesia of skin: Secondary | ICD-10-CM

## 2020-11-09 DIAGNOSIS — M79641 Pain in right hand: Secondary | ICD-10-CM | POA: Diagnosis not present

## 2020-11-09 DIAGNOSIS — M79642 Pain in left hand: Secondary | ICD-10-CM | POA: Diagnosis not present

## 2020-11-09 NOTE — Addendum Note (Signed)
Addended by: Delma Officer on: 11/09/2020 02:46 PM   Modules accepted: Orders

## 2020-11-11 NOTE — Progress Notes (Signed)
Good afternoon. Your x-ray for both of your hands and wrist was normal.

## 2020-11-14 NOTE — Telephone Encounter (Signed)
error 

## 2020-12-11 ENCOUNTER — Inpatient Hospital Stay: Payer: BC Managed Care – PPO | Admitting: Internal Medicine

## 2021-01-25 DIAGNOSIS — Z20822 Contact with and (suspected) exposure to covid-19: Secondary | ICD-10-CM | POA: Diagnosis not present

## 2021-03-27 DIAGNOSIS — Z1231 Encounter for screening mammogram for malignant neoplasm of breast: Secondary | ICD-10-CM | POA: Diagnosis not present

## 2021-03-27 DIAGNOSIS — Z8042 Family history of malignant neoplasm of prostate: Secondary | ICD-10-CM | POA: Diagnosis not present

## 2021-03-30 DIAGNOSIS — Z202 Contact with and (suspected) exposure to infections with a predominantly sexual mode of transmission: Secondary | ICD-10-CM | POA: Diagnosis not present

## 2021-03-30 DIAGNOSIS — N898 Other specified noninflammatory disorders of vagina: Secondary | ICD-10-CM | POA: Diagnosis not present

## 2021-03-30 DIAGNOSIS — D250 Submucous leiomyoma of uterus: Secondary | ICD-10-CM | POA: Diagnosis not present

## 2021-03-30 DIAGNOSIS — Z1159 Encounter for screening for other viral diseases: Secondary | ICD-10-CM | POA: Diagnosis not present

## 2021-03-30 DIAGNOSIS — Z780 Asymptomatic menopausal state: Secondary | ICD-10-CM | POA: Diagnosis not present

## 2021-03-30 DIAGNOSIS — Z13228 Encounter for screening for other metabolic disorders: Secondary | ICD-10-CM | POA: Diagnosis not present

## 2021-03-30 DIAGNOSIS — N926 Irregular menstruation, unspecified: Secondary | ICD-10-CM | POA: Diagnosis not present

## 2021-03-30 DIAGNOSIS — Z13220 Encounter for screening for lipoid disorders: Secondary | ICD-10-CM | POA: Diagnosis not present

## 2021-03-30 DIAGNOSIS — Z131 Encounter for screening for diabetes mellitus: Secondary | ICD-10-CM | POA: Diagnosis not present

## 2021-03-30 DIAGNOSIS — Z9851 Tubal ligation status: Secondary | ICD-10-CM | POA: Diagnosis not present

## 2021-03-30 DIAGNOSIS — Z01411 Encounter for gynecological examination (general) (routine) with abnormal findings: Secondary | ICD-10-CM | POA: Diagnosis not present

## 2021-03-30 DIAGNOSIS — N888 Other specified noninflammatory disorders of cervix uteri: Secondary | ICD-10-CM | POA: Diagnosis not present

## 2021-03-30 DIAGNOSIS — Z8042 Family history of malignant neoplasm of prostate: Secondary | ICD-10-CM | POA: Diagnosis not present

## 2021-03-30 DIAGNOSIS — Z01419 Encounter for gynecological examination (general) (routine) without abnormal findings: Secondary | ICD-10-CM | POA: Diagnosis not present

## 2021-03-31 IMAGING — CR DG HAND COMPLETE 3+V*R*
3 series · 3 of 3 positions shown · non-contrast
Comparison: None.

CLINICAL DATA: Patient fell on both hands at work. Bilateral hand
pain.

EXAM:
RIGHT HAND - COMPLETE 3+ VIEW

[x hand pa right]
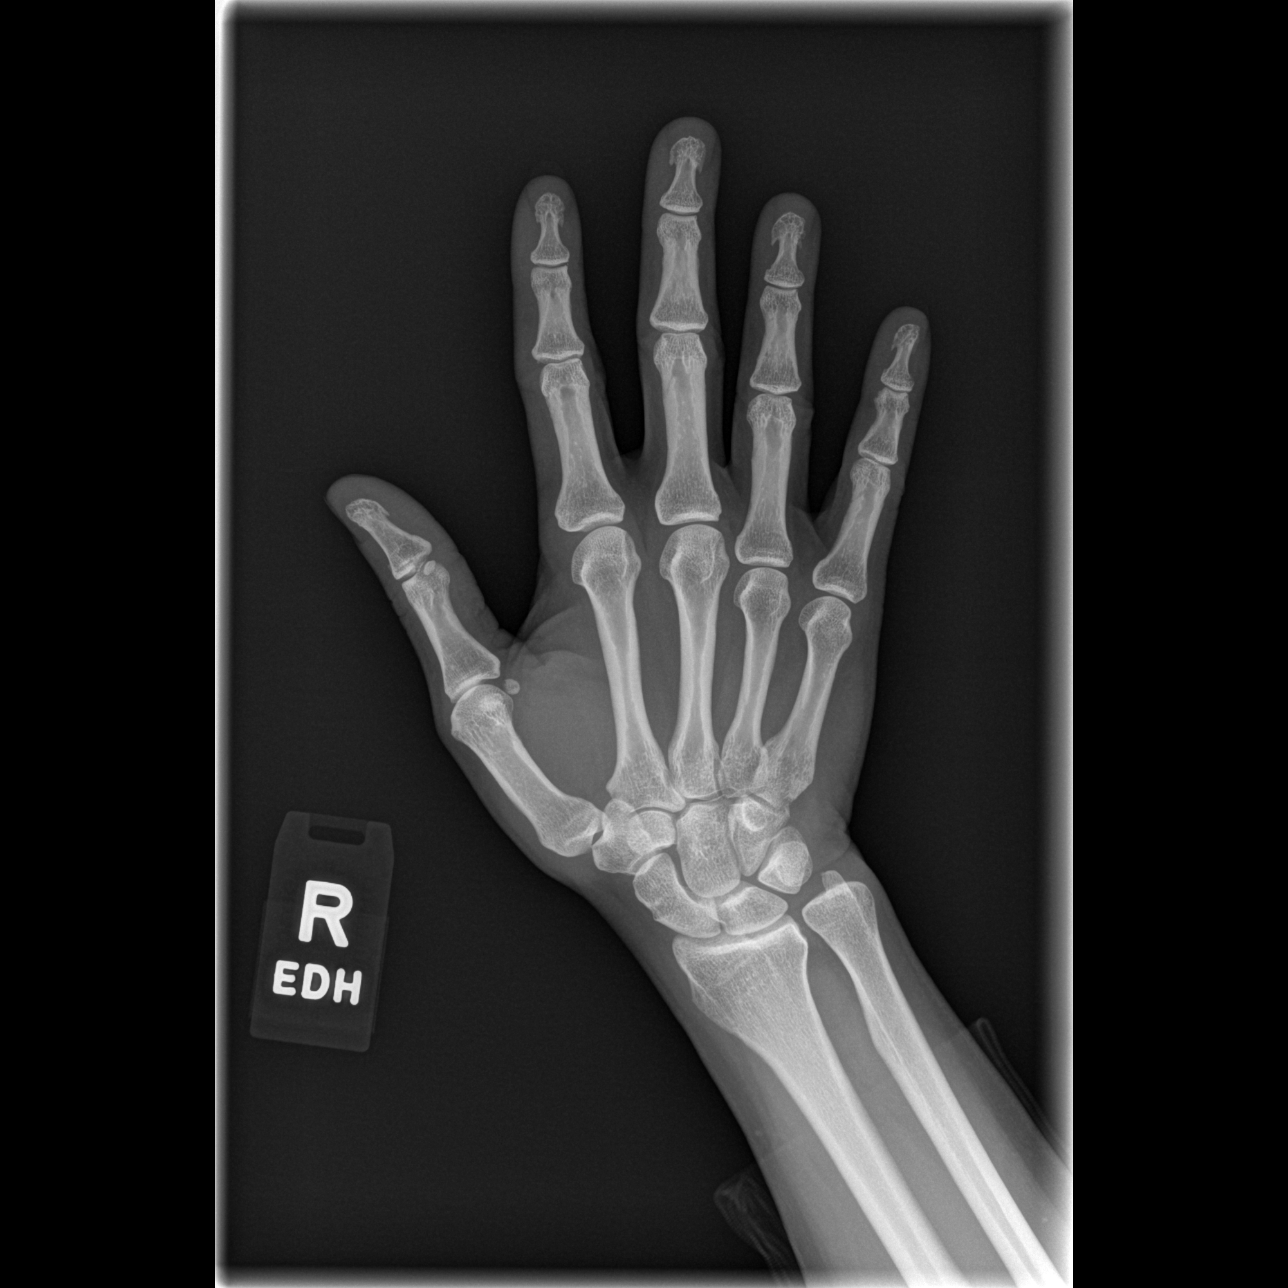

[x hand oblique right]
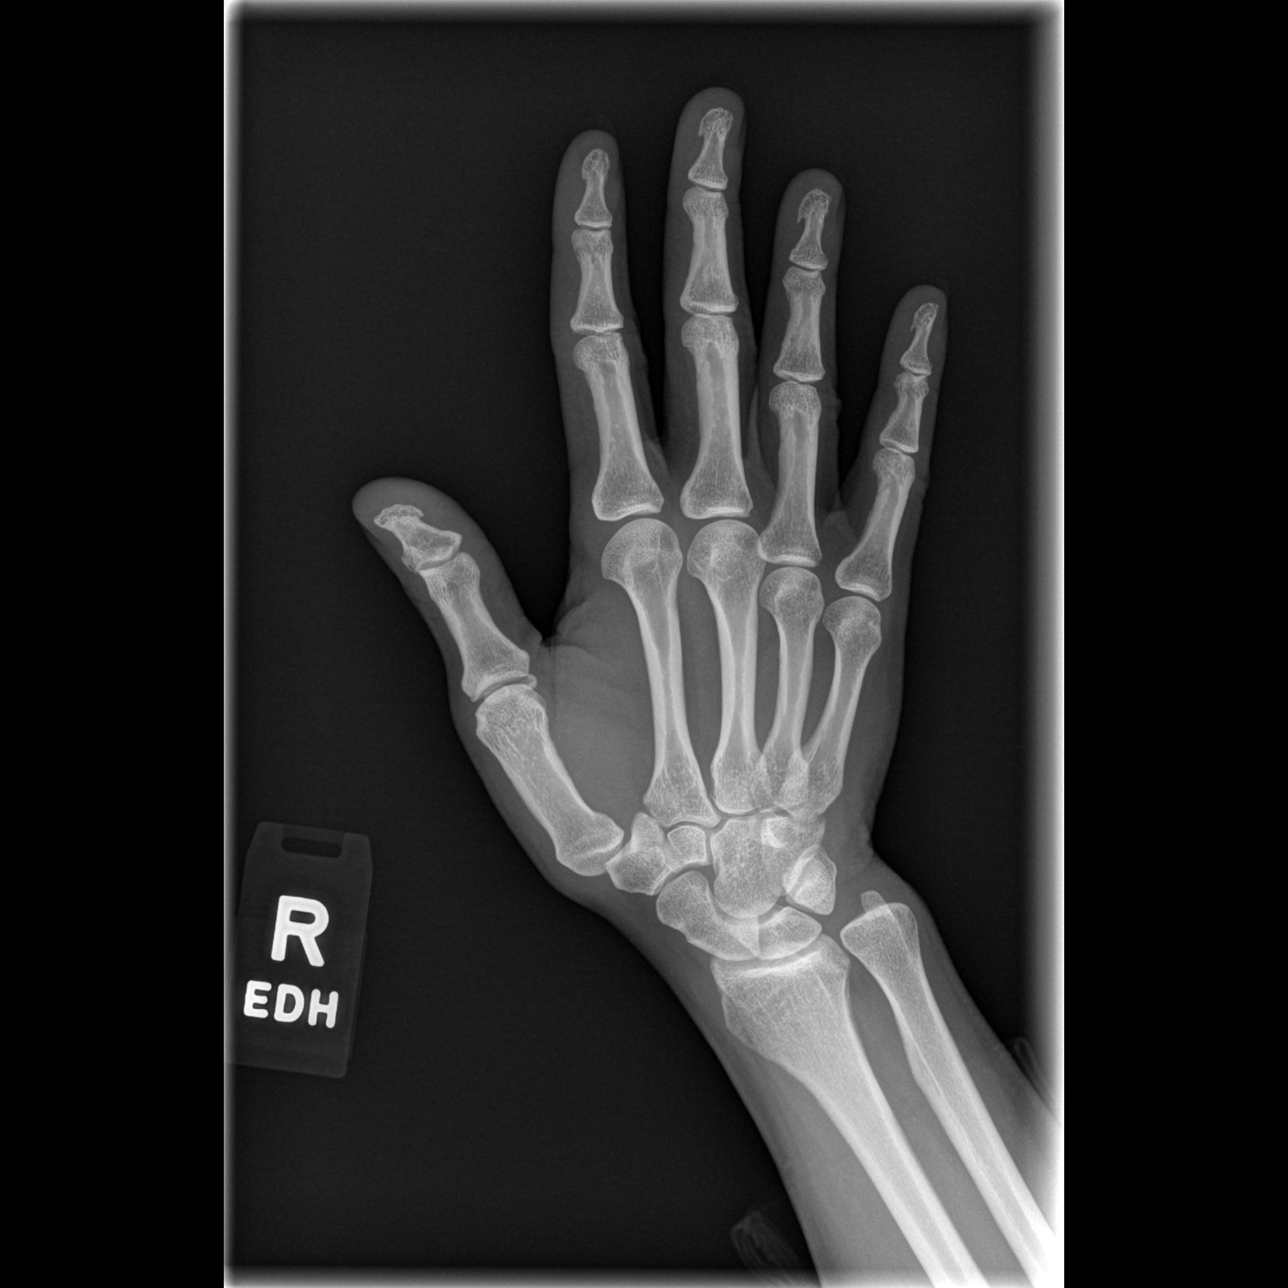

[x hand lat right]
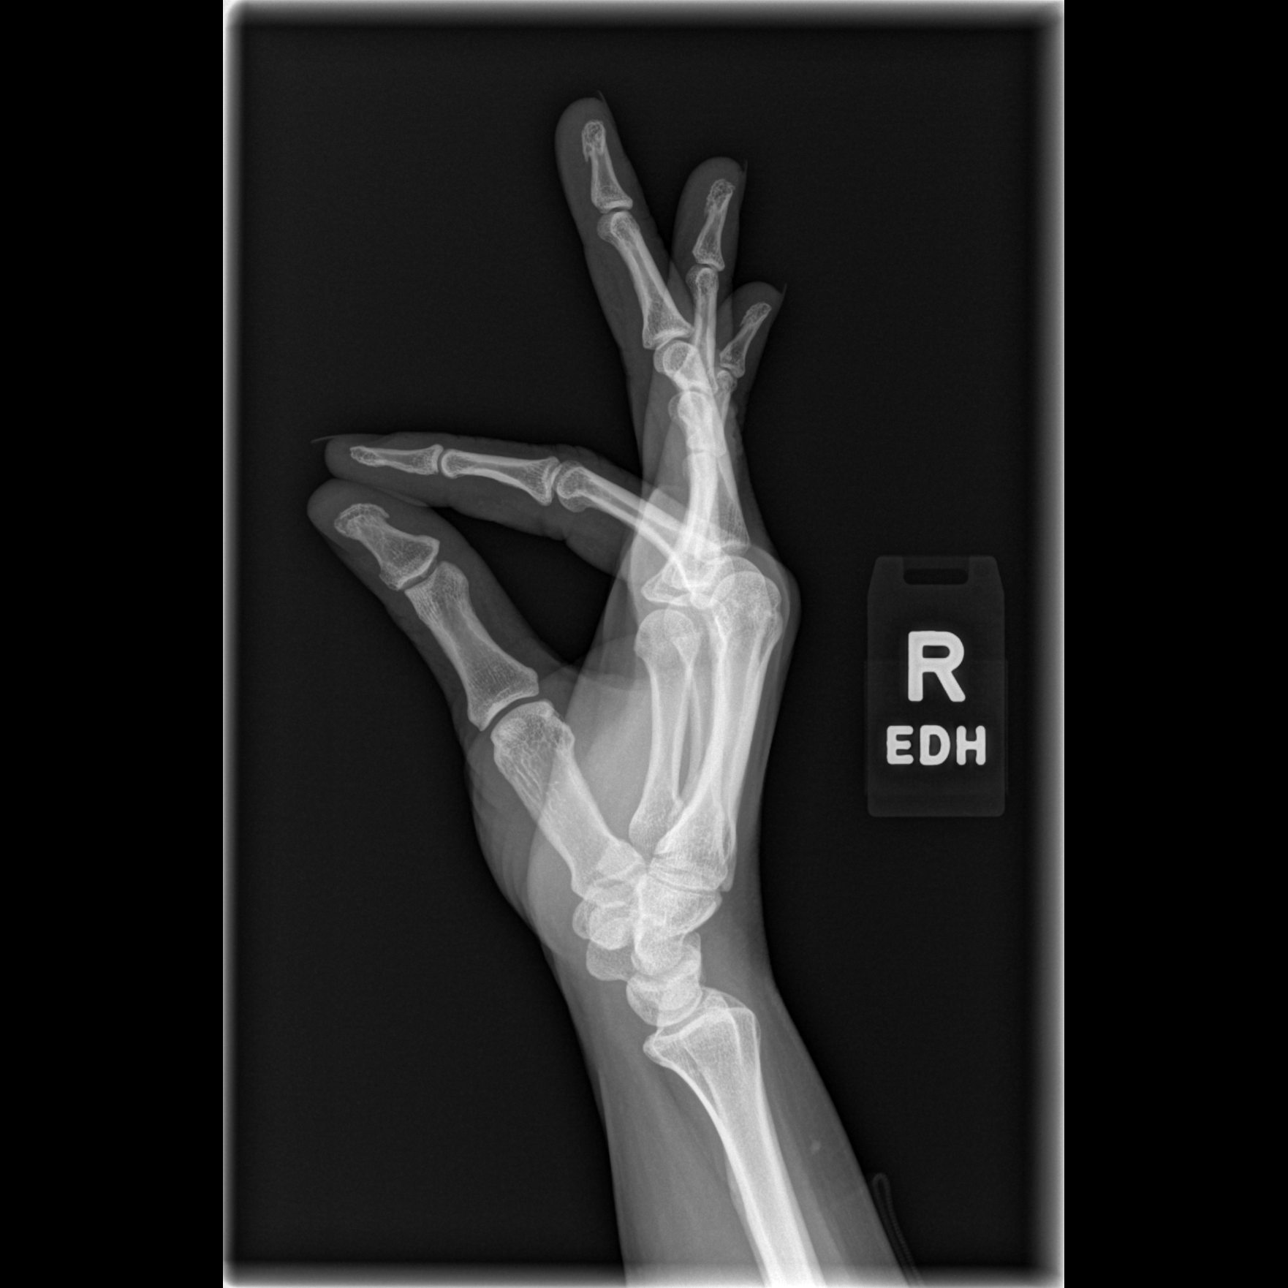

[3 of 3 positions shown; findings below may reference images not displayed]

FINDINGS: There is no evidence of fracture or dislocation. There is no
evidence of arthropathy or other focal bone abnormality. Soft
tissues are unremarkable.
IMPRESSION: Negative.

## 2021-04-02 DIAGNOSIS — R928 Other abnormal and inconclusive findings on diagnostic imaging of breast: Secondary | ICD-10-CM | POA: Diagnosis not present

## 2021-04-05 ENCOUNTER — Encounter: Payer: Self-pay | Admitting: Internal Medicine

## 2021-04-05 ENCOUNTER — Other Ambulatory Visit: Payer: Self-pay

## 2021-04-05 ENCOUNTER — Ambulatory Visit (INDEPENDENT_AMBULATORY_CARE_PROVIDER_SITE_OTHER): Payer: BC Managed Care – PPO | Admitting: Internal Medicine

## 2021-04-05 VITALS — BP 112/66 | HR 76 | Temp 98.2°F | Ht 61.0 in | Wt 136.4 lb

## 2021-04-05 DIAGNOSIS — Z2821 Immunization not carried out because of patient refusal: Secondary | ICD-10-CM

## 2021-04-05 DIAGNOSIS — Z0000 Encounter for general adult medical examination without abnormal findings: Secondary | ICD-10-CM | POA: Diagnosis not present

## 2021-04-05 DIAGNOSIS — Z23 Encounter for immunization: Secondary | ICD-10-CM

## 2021-04-05 NOTE — Progress Notes (Signed)
I,Katawbba Wiggins,acting as a Neurosurgeon for Gwynneth Aliment, MD.,have documented all relevant documentation on the behalf of Gwynneth Aliment, MD,as directed by  Gwynneth Aliment, MD while in the presence of Gwynneth Aliment, MD.  This visit occurred during the SARS-CoV-2 public health emergency.  Safety protocols were in place, including screening questions prior to the visit, additional usage of staff PPE, and extensive cleaning of exam room while observing appropriate contact time as indicated for disinfecting solutions.  Subjective:     Patient ID: Sydney Wiggins , female    DOB: 1965/12/19 , 55 y.o.   MRN: [id number]   Chief Complaint  Patient presents with  . Annual Exam    HPI  She is here today for a full physical examination.  She is followed by Dr. Annye Rusk for her GYN exams.  She was last seen March 30, 2021.  She admits she been stressed with the care of her mother who unfortunately had complications from hospitalization for diverticulitis. She has no other concerns at this time.    Past Medical History:  Diagnosis Date  . Iron deficiency anemia      Family History  Problem Relation Age of Onset  . Healthy Mother   . Stroke Mother   . Prostate cancer Father   . Heart attack Father      Current Outpatient Medications:  Marland Kitchen  MAGNESIUM PO, Take by mouth. 435 mg daily, Disp: , Rfl:    No Known Allergies    The patient states she uses tubal ligation for birth control. Last LMP was No LMP recorded (lmp unknown). Patient is postmenopausal.. Negative for Dysmenorrhea. Negative for: breast discharge, breast lump(s), breast pain and breast self exam. Associated symptoms include abnormal vaginal bleeding. Pertinent negatives include abnormal bleeding (hematology), anxiety, decreased libido, depression, difficulty falling sleep, dyspareunia, history of infertility, nocturia, sexual dysfunction, sleep disturbances, urinary incontinence, urinary urgency, vaginal discharge and  vaginal itching. Diet regular.The patient states her exercise level is  intermittent.   . The patient's tobacco use is:  Social History   Tobacco Use  Smoking Status Never Smoker  Smokeless Tobacco Never Used  . She has been exposed to passive smoke. The patient's alcohol use is:  Social History   Substance and Sexual Activity  Alcohol Use No    Review of Systems  Constitutional: Negative.   HENT: Negative.   Eyes: Negative.   Respiratory: Negative.   Cardiovascular: Negative.   Gastrointestinal: Negative.   Endocrine: Negative.   Genitourinary: Negative.   Musculoskeletal: Negative.   Skin: Negative.   Allergic/Immunologic: Negative.   Neurological: Negative.   Hematological: Negative.   Psychiatric/Behavioral: Negative.      Today's Vitals   04/05/21 0848  BP: 112/66  Pulse: 76  Temp: 98.2 F (36.8 C)  TempSrc: Oral  Weight: 136 lb 6.4 oz (61.9 kg)  Height: 5\' 1"  (1.549 m)   Body mass index is 25.77 kg/m.  Wt Readings from Last 3 Encounters:  04/05/21 136 lb 6.4 oz (61.9 kg)  11/08/20 136 lb 3.2 oz (61.8 kg)  03/30/20 144 lb 3.2 oz (65.4 kg)   BP Readings from Last 3 Encounters:  04/05/21 112/66  11/08/20 120/70  03/30/20 108/66   Objective:  Physical Exam Vitals and nursing note reviewed.  Constitutional:      Appearance: Normal appearance.  HENT:     Head: Normocephalic and atraumatic.     Right Ear: Tympanic membrane, ear canal and external ear normal.  Left Ear: Tympanic membrane, ear canal and external ear normal.     Nose:     Comments: Masked     Mouth/Throat:     Comments: Masked  Eyes:     Extraocular Movements: Extraocular movements intact.     Conjunctiva/sclera: Conjunctivae normal.     Pupils: Pupils are equal, round, and reactive to light.  Cardiovascular:     Rate and Rhythm: Normal rate and regular rhythm.     Pulses: Normal pulses.     Heart sounds: Normal heart sounds.  Pulmonary:     Effort: Pulmonary effort is normal.      Breath sounds: Normal breath sounds.  Chest:  Breasts:     Tanner Score is 5.     Right: Normal.     Left: Normal.    Abdominal:     General: Abdomen is flat. Bowel sounds are normal.     Palpations: Abdomen is soft.  Genitourinary:    Comments: deferred Musculoskeletal:        General: Normal range of motion.     Cervical back: Normal range of motion and neck supple.  Skin:    General: Skin is warm and dry.  Neurological:     General: No focal deficit present.     Mental Status: She is alert and oriented to person, place, and time.  Psychiatric:        Mood and Affect: Mood normal.        Behavior: Behavior normal.         Assessment And Plan:     1. Routine general medical examination at health care facility Comments: A full exam was performed. Importance of monthly self breast exams was discussed with the patient. She is up to date with colonoscopy/mammo and pap screenings. PATIENT IS ADVISED TO GET 30-45 MINUTES REGULAR EXERCISE NO LESS THAN FOUR TO FIVE DAYS PER WEEK - BOTH WEIGHTBEARING EXERCISES AND AEROBIC ARE RECOMMENDED.  PATIENT IS ADVISED TO FOLLOW A HEALTHY DIET WITH AT LEAST SIX FRUITS/VEGGIES PER DAY, DECREASE INTAKE OF RED MEAT, AND TO INCREASE FISH INTAKE TO TWO DAYS PER WEEK.  MEATS/FISH SHOULD NOT BE FRIED, BAKED OR BROILED IS PREFERABLE.  IT IS ALSO IMPORTANT TO CUT BACK ON YOUR SUGAR INTAKE. PLEASE AVOID ANYTHING WITH ADDED SUGAR, CORN SYRUP OR OTHER SWEETENERS. IF YOU MUST USE A SWEETENER, YOU CAN TRY STEVIA. IT IS ALSO IMPORTANT TO AVOID ARTIFICIALLY SWEETENERS AND DIET BEVERAGES. LASTLY, I SUGGEST WEARING SPF 50 SUNSCREEN ON EXPOSED PARTS AND ESPECIALLY WHEN IN THE DIRECT SUNLIGHT FOR AN EXTENDED PERIOD OF TIME.  PLEASE AVOID FAST FOOD RESTAURANTS AND INCREASE YOUR WATER INTAKE.  - Hepatitis C antibody  2. Immunization due Comments: She declines both Shingrix and COVID vaccines.    Patient was given opportunity to ask questions. Patient verbalized  understanding of the plan and was able to repeat key elements of the plan. All questions were answered to their satisfaction.   I, Gwynneth Aliment, MD, have reviewed all documentation for this visit. The documentation on 04/05/21 for the exam, diagnosis, procedures, and orders are all accurate and complete.  THE PATIENT IS ENCOURAGED TO PRACTICE SOCIAL DISTANCING DUE TO THE COVID-19 PANDEMIC.

## 2021-04-05 NOTE — Patient Instructions (Signed)
Health Maintenance, Female Adopting a healthy lifestyle and getting preventive care are important in promoting health and wellness. Ask your health care provider about:  The right schedule for you to have regular tests and exams.  Things you can do on your own to prevent diseases and keep yourself healthy. What should I know about diet, weight, and exercise? Eat a healthy diet  Eat a diet that includes plenty of vegetables, fruits, low-fat dairy products, and lean protein.  Do not eat a lot of foods that are high in solid fats, added sugars, or sodium.   Maintain a healthy weight Body mass index (BMI) is used to identify weight problems. It estimates body fat based on height and weight. Your health care provider can help determine your BMI and help you achieve or maintain a healthy weight. Get regular exercise Get regular exercise. This is one of the most important things you can do for your health. Most adults should:  Exercise for at least 150 minutes each week. The exercise should increase your heart rate and make you sweat (moderate-intensity exercise).  Do strengthening exercises at least twice a week. This is in addition to the moderate-intensity exercise.  Spend less time sitting. Even light physical activity can be beneficial. Watch cholesterol and blood lipids Have your blood tested for lipids and cholesterol at 53 years of age, then have this test every 5 years. Have your cholesterol levels checked more often if:  Your lipid or cholesterol levels are high.  You are older than 53 years of age.  You are at high risk for heart disease. What should I know about cancer screening? Depending on your health history and family history, you may need to have cancer screening at various ages. This may include screening for:  Breast cancer.  Cervical cancer.  Colorectal cancer.  Skin cancer.  Lung cancer. What should I know about heart disease, diabetes, and high blood  pressure? Blood pressure and heart disease  High blood pressure causes heart disease and increases the risk of stroke. This is more likely to develop in people who have high blood pressure readings, are of African descent, or are overweight.  Have your blood pressure checked: ? Every 3-5 years if you are 18-39 years of age. ? Every year if you are 40 years old or older. Diabetes Have regular diabetes screenings. This checks your fasting blood sugar level. Have the screening done:  Once every three years after age 40 if you are at a normal weight and have a low risk for diabetes.  More often and at a younger age if you are overweight or have a high risk for diabetes. What should I know about preventing infection? Hepatitis B If you have a higher risk for hepatitis B, you should be screened for this virus. Talk with your health care provider to find out if you are at risk for hepatitis B infection. Hepatitis C Testing is recommended for:  Everyone born from 1945 through 1965.  Anyone with known risk factors for hepatitis C. Sexually transmitted infections (STIs)  Get screened for STIs, including gonorrhea and chlamydia, if: ? You are sexually active and are younger than 53 years of age. ? You are older than 53 years of age and your health care provider tells you that you are at risk for this type of infection. ? Your sexual activity has changed since you were last screened, and you are at increased risk for chlamydia or gonorrhea. Ask your health care provider   if you are at risk.  Ask your health care provider about whether you are at high risk for HIV. Your health care provider may recommend a prescription medicine to help prevent HIV infection. If you choose to take medicine to prevent HIV, you should first get tested for HIV. You should then be tested every 3 months for as long as you are taking the medicine. Pregnancy  If you are about to stop having your period (premenopausal) and  you may become pregnant, seek counseling before you get pregnant.  Take 400 to 800 micrograms (mcg) of folic acid every day if you become pregnant.  Ask for birth control (contraception) if you want to prevent pregnancy. Osteoporosis and menopause Osteoporosis is a disease in which the bones lose minerals and strength with aging. This can result in bone fractures. If you are 65 years old or older, or if you are at risk for osteoporosis and fractures, ask your health care provider if you should:  Be screened for bone loss.  Take a calcium or vitamin D supplement to lower your risk of fractures.  Be given hormone replacement therapy (HRT) to treat symptoms of menopause. Follow these instructions at home: Lifestyle  Do not use any products that contain nicotine or tobacco, such as cigarettes, e-cigarettes, and chewing tobacco. If you need help quitting, ask your health care provider.  Do not use street drugs.  Do not share needles.  Ask your health care provider for help if you need support or information about quitting drugs. Alcohol use  Do not drink alcohol if: ? Your health care provider tells you not to drink. ? You are pregnant, may be pregnant, or are planning to become pregnant.  If you drink alcohol: ? Limit how much you use to 0-1 drink a day. ? Limit intake if you are breastfeeding.  Be aware of how much alcohol is in your drink. In the [place] one drink equals one 12 oz bottle of beer (355 mL), one 5 oz glass of wine (148 mL), or one 1 oz glass of hard liquor (44 mL). General instructions  Schedule regular health, dental, and eye exams.  Stay current with your vaccines.  Tell your health care provider if: ? You often feel depressed. ? You have ever been abused or do not feel safe at home. Summary  Adopting a healthy lifestyle and getting preventive care are important in promoting health and wellness.  Follow your health care provider's instructions about healthy  diet, exercising, and getting tested or screened for diseases.  Follow your health care provider's instructions on monitoring your cholesterol and blood pressure. This information is not intended to replace advice given to you by your health care provider. Make sure you discuss any questions you have with your health care provider. Document Revised: 11/11/2018 Document Reviewed: 11/11/2018 Elsevier Patient Education  2021 [employer]  

## 2021-04-06 LAB — HEPATITIS C ANTIBODY: Hep C Virus Ab: <0.1 s/co ratio (ref 0.0–0.9)

## 2022-04-08 ENCOUNTER — Encounter: Payer: Self-pay | Admitting: Internal Medicine

## 2022-04-08 ENCOUNTER — Ambulatory Visit (INDEPENDENT_AMBULATORY_CARE_PROVIDER_SITE_OTHER): Payer: Commercial Managed Care - HMO | Admitting: Internal Medicine

## 2022-04-08 VITALS — BP 110/64 | HR 77 | Temp 98.2°F | Ht 61.0 in | Wt 120.0 lb

## 2022-04-08 DIAGNOSIS — E559 Vitamin D deficiency, unspecified: Secondary | ICD-10-CM

## 2022-04-08 DIAGNOSIS — M25511 Pain in right shoulder: Secondary | ICD-10-CM | POA: Diagnosis not present

## 2022-04-08 DIAGNOSIS — G8929 Other chronic pain: Secondary | ICD-10-CM

## 2022-04-08 DIAGNOSIS — Z2821 Immunization not carried out because of patient refusal: Secondary | ICD-10-CM

## 2022-04-08 DIAGNOSIS — Z6822 Body mass index (BMI) 22.0-22.9, adult: Secondary | ICD-10-CM

## 2022-04-08 DIAGNOSIS — Z86010 Personal history of colonic polyps: Secondary | ICD-10-CM

## 2022-04-08 DIAGNOSIS — Z0000 Encounter for general adult medical examination without abnormal findings: Secondary | ICD-10-CM | POA: Diagnosis not present

## 2022-04-08 NOTE — Patient Instructions (Signed)
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
## 2022-04-09 NOTE — Addendum Note (Signed)
Addended by: Gwynneth Aliment on: 04/09/2022 09:18 PM ? ? Modules accepted: Level of Service ? ?

## 2022-04-10 LAB — CMP14+EGFR
ALT: 9 IU/L (ref 0–32)
AST: 15 IU/L (ref 0–40)
Albumin/Globulin Ratio: 1.8 (ref 1.2–2.2)
Albumin: 4.7 g/dL (ref 3.8–4.9)
Alkaline Phosphatase: 62 IU/L (ref 44–121)
BUN/Creatinine Ratio: 11 (ref 9–23)
BUN: 8 mg/dL (ref 6–24)
Bilirubin Total: 1.1 mg/dL (ref 0.0–1.2)
CO2: 24 mmol/L (ref 20–29)
Calcium: 9.7 mg/dL (ref 8.7–10.2)
Chloride: 105 mmol/L (ref 96–106)
Creatinine, Ser: 0.7 mg/dL (ref 0.57–1.00)
Globulin, Total: 2.6 g/dL (ref 1.5–4.5)
Glucose: 86 mg/dL (ref 70–99)
Potassium: 4.2 mmol/L (ref 3.5–5.2)
Sodium: 143 mmol/L (ref 134–144)
Total Protein: 7.3 g/dL (ref 6.0–8.5)
eGFR: 103 mL/min/{1.73_m2} (ref >59)

## 2022-04-10 LAB — VITAMIN D 25 HYDROXY (VIT D DEFICIENCY, FRACTURES): Vit D, 25-Hydroxy: 26.6 ng/mL — ABNORMAL LOW (ref 30.0–100.0)

## 2022-04-10 LAB — CBC
Hematocrit: 32.9 % — ABNORMAL LOW (ref 34.0–46.6)
Hemoglobin: 10.9 g/dL — ABNORMAL LOW (ref 11.1–15.9)
MCH: 29.1 pg (ref 26.6–33.0)
MCHC: 33.1 g/dL (ref 31.5–35.7)
MCV: 88 fL (ref 79–97)
Platelets: 176 10*3/uL (ref 150–450)
RBC: 3.75 x10E6/uL — ABNORMAL LOW (ref 3.77–5.28)
RDW: 13.2 % (ref 11.7–15.4)
WBC: 2.3 10*3/uL — CL (ref 3.4–10.8)

## 2022-04-10 LAB — LIPID PANEL
Chol/HDL Ratio: 4.4 ratio (ref 0.0–4.4)
Cholesterol, Total: 244 mg/dL — ABNORMAL HIGH (ref 100–199)
HDL: 56 mg/dL (ref >39)
LDL Chol Calc (NIH): 178 mg/dL — ABNORMAL HIGH (ref 0–99)
Triglycerides: 59 mg/dL (ref 0–149)
VLDL Cholesterol Cal: 10 mg/dL (ref 5–40)

## 2022-04-10 LAB — VITAMIN B12: Vitamin B-12: >2000 pg/mL — ABNORMAL HIGH (ref 232–1245)

## 2022-04-14 ENCOUNTER — Encounter: Payer: Self-pay | Admitting: Internal Medicine

## 2022-06-07 LAB — HM MAMMOGRAPHY: HM Mammogram: NORMAL (ref 0–4)

## 2022-07-03 ENCOUNTER — Ambulatory Visit: Payer: Commercial Managed Care - HMO | Admitting: Internal Medicine

## 2022-07-04 ENCOUNTER — Ambulatory Visit: Payer: Commercial Managed Care - HMO | Admitting: Internal Medicine

## 2022-07-04 ENCOUNTER — Other Ambulatory Visit: Payer: Commercial Managed Care - HMO

## 2022-07-04 DIAGNOSIS — R899 Unspecified abnormal finding in specimens from other organs, systems and tissues: Secondary | ICD-10-CM

## 2022-07-04 LAB — CBC
Hematocrit: 30.4 % — ABNORMAL LOW (ref 34.0–46.6)
Hemoglobin: 10.2 g/dL — ABNORMAL LOW (ref 11.1–15.9)
MCH: 29.3 pg (ref 26.6–33.0)
MCHC: 33.6 g/dL (ref 31.5–35.7)
MCV: 87 fL (ref 79–97)
Platelets: 176 10*3/uL (ref 150–450)
RBC: 3.48 x10E6/uL — ABNORMAL LOW (ref 3.77–5.28)
RDW: 13 % (ref 11.7–15.4)
WBC: 2.8 10*3/uL — ABNORMAL LOW (ref 3.4–10.8)

## 2022-11-19 LAB — HM COLONOSCOPY

## 2023-04-16 ENCOUNTER — Encounter: Payer: Self-pay | Admitting: Internal Medicine

## 2023-04-16 ENCOUNTER — Ambulatory Visit (INDEPENDENT_AMBULATORY_CARE_PROVIDER_SITE_OTHER): Payer: Medicaid Other | Admitting: Internal Medicine

## 2023-04-16 VITALS — BP 108/74 | HR 93 | Temp 98.2°F | Ht 61.0 in | Wt 123.2 lb

## 2023-04-16 DIAGNOSIS — M25561 Pain in right knee: Secondary | ICD-10-CM

## 2023-04-16 DIAGNOSIS — Z2821 Immunization not carried out because of patient refusal: Secondary | ICD-10-CM | POA: Diagnosis not present

## 2023-04-16 DIAGNOSIS — M25562 Pain in left knee: Secondary | ICD-10-CM

## 2023-04-16 DIAGNOSIS — G8929 Other chronic pain: Secondary | ICD-10-CM

## 2023-04-16 DIAGNOSIS — Z8249 Family history of ischemic heart disease and other diseases of the circulatory system: Secondary | ICD-10-CM

## 2023-04-16 DIAGNOSIS — Z0000 Encounter for general adult medical examination without abnormal findings: Secondary | ICD-10-CM | POA: Diagnosis not present

## 2023-04-16 NOTE — Progress Notes (Signed)
I,Victoria T Hamilton,acting as a scribe for Gwynneth Aliment, MD.,have documented all relevant documentation on the behalf of Gwynneth Aliment, MD,as directed by  Gwynneth Aliment, MD while in the presence of Gwynneth Aliment, MD.   Subjective:     Patient ID: Sydney Wiggins , female    DOB: Sep 04, 1966 , 57 y.o.   MRN: [id number]   Chief Complaint  Patient presents with   Annual Exam    HPI  She is here today for a full physical examination.  She is followed by Dr. Annye Rusk for her GYN exams.  She reports she will call her GYN office to schedule pap & mammogram.   Today, she is also concerned about b/l knee pain. Denies fall/trauma. There is some pain with ambulation. Sx started well over 3 months ago. She does have stiffness after being seated for long periods of time. She does not wish to take any prescription or OTC meds. She has tried using tiger balm as well as herbs/teas which have provided some relief. She would like referral to an orthopedic.      Past Medical History:  Diagnosis Date   Iron deficiency anemia      Family History  Problem Relation Age of Onset   Healthy Mother    Stroke Mother    Prostate cancer Father    Heart attack Father      Current Outpatient Medications:    cholecalciferol (VITAMIN D3) 25 MCG (1000 UNIT) tablet, Take by mouth., Disp: , Rfl:    cyanocobalamin 100 MCG tablet, Take by mouth., Disp: , Rfl:    ferrous sulfate 325 (65 FE) MG tablet, Take by mouth., Disp: , Rfl:    MAGNESIUM PO, Take by mouth. 435 mg daily, Disp: , Rfl:    No Known Allergies    The patient states she uses post menopausal status for birth control. Last LMP was No LMP recorded (lmp unknown). Patient is postmenopausal.. Negative for Dysmenorrhea. Negative for: breast discharge, breast lump(s), breast pain and breast self exam. Associated symptoms include abnormal vaginal bleeding. Pertinent negatives include abnormal bleeding (hematology), anxiety, decreased  libido, depression, difficulty falling sleep, dyspareunia, history of infertility, nocturia, sexual dysfunction, sleep disturbances, urinary incontinence, urinary urgency, vaginal discharge and vaginal itching. Diet regular.The patient states her exercise level is     . The patient's tobacco use is:  Social History   Tobacco Use  Smoking Status Never  Smokeless Tobacco Never  . She has been exposed to passive smoke. The patient's alcohol use is:  Social History   Substance and Sexual Activity  Alcohol Use No   Review of Systems  Constitutional: Negative.   HENT: Negative.    Eyes: Negative.   Respiratory: Negative.    Cardiovascular: Negative.   Gastrointestinal: Negative.   Endocrine: Negative.   Genitourinary: Negative.   Musculoskeletal:  Positive for arthralgias.       She c/o b/l knee pain. She states her sx started at least six months. She describes it as a stinging pain that radiates from her knees towards her ankles. Right knee hurts more than the left typically. There is some pain with ambulation. She has tried topical tiger balm. She does not wish to take Tylenol. She also takes herbal supplements.   Skin: Negative.   Allergic/Immunologic: Negative.   Neurological: Negative.   Hematological: Negative.   Psychiatric/Behavioral: Negative.       Today's Vitals   04/16/23 1035  BP: 108/74  Pulse: 93  Temp: 98.2 F (36.8 C)  SpO2: 98%  Weight: 123 lb 3.2 oz (55.9 kg)  Height: 5\' 1"  (1.549 m)   Body mass index is 23.28 kg/m.  Wt Readings from Last 3 Encounters:  04/16/23 123 lb 3.2 oz (55.9 kg)  04/08/22 120 lb (54.4 kg)  04/05/21 136 lb 6.4 oz (61.9 kg)  The 10-year ASCVD risk score (Arnett DK, et al., 2019) is: 2.2%   Values used to calculate the score:     Age: 41 years     Sex: Female     Is Non-Hispanic African American: Yes     Diabetic: No     Tobacco smoker: No     Systolic Blood Pressure: 108 mmHg     Is BP treated: No     HDL Cholesterol: 56  mg/dL     Total Cholesterol: 244 mg/dL    Objective:  Physical Exam Vitals and nursing note reviewed.  Constitutional:      Appearance: Normal appearance.  HENT:     Head: Normocephalic and atraumatic.     Right Ear: Tympanic membrane, ear canal and external ear normal.     Left Ear: Tympanic membrane, ear canal and external ear normal.     Nose: Nose normal.     Mouth/Throat:     Mouth: Mucous membranes are moist.     Pharynx: Oropharynx is clear.  Eyes:     Extraocular Movements: Extraocular movements intact.     Conjunctiva/sclera: Conjunctivae normal.     Pupils: Pupils are equal, round, and reactive to light.  Cardiovascular:     Rate and Rhythm: Normal rate and regular rhythm.     Pulses: Normal pulses.     Heart sounds: Normal heart sounds.  Pulmonary:     Effort: Pulmonary effort is normal.     Breath sounds: Normal breath sounds.  Chest:  Breasts:    Tanner Score is 5.     Right: Normal.     Left: Normal.  Abdominal:     General: Abdomen is flat. Bowel sounds are normal.     Palpations: Abdomen is soft.  Genitourinary:    Comments: deferred Musculoskeletal:        General: Normal range of motion.     Cervical back: Normal range of motion and neck supple.     Comments: B/l crepitus, no swelling noted. No overlying erythema/warmth.   Skin:    General: Skin is warm and dry.  Neurological:     General: No focal deficit present.     Mental Status: She is alert and oriented to person, place, and time.  Psychiatric:        Mood and Affect: Mood normal.        Behavior: Behavior normal.       Assessment And Plan:     1. Routine general medical examination at health care facility Comments: A full exam was performed. Importance of monthly self breast exams was discussed with the patient. PATIENT IS ADVISED TO GET 30-45 MINUTES REGULAR EXERCISE NO LESS THAN FOUR TO FIVE DAYS PER WEEK - BOTH WEIGHTBEARING EXERCISES AND AEROBIC ARE RECOMMENDED.  PATIENT IS ADVISED  TO FOLLOW A HEALTHY DIET WITH AT LEAST SIX FRUITS/VEGGIES PER DAY, DECREASE INTAKE OF RED MEAT, AND TO INCREASE FISH INTAKE TO TWO DAYS PER WEEK.  MEATS/FISH SHOULD NOT BE FRIED, BAKED OR BROILED IS PREFERABLE.  IT IS ALSO IMPORTANT TO CUT BACK ON YOUR SUGAR INTAKE. PLEASE AVOID ANYTHING WITH ADDED SUGAR, CORN  SYRUP OR OTHER SWEETENERS. IF YOU MUST USE A SWEETENER, YOU CAN TRY STEVIA. IT IS ALSO IMPORTANT TO AVOID ARTIFICIALLY SWEETENERS AND DIET BEVERAGES. LASTLY, I SUGGEST WEARING SPF 50 SUNSCREEN ON EXPOSED PARTS AND ESPECIALLY WHEN IN THE DIRECT SUNLIGHT FOR AN EXTENDED PERIOD OF TIME.  PLEASE AVOID FAST FOOD RESTAURANTS AND INCREASE YOUR WATER INTAKE. - CBC - CMP14+EGFR - Lipid panel  2. Chronic pain of both knees Comments: She does not wish to take Tylenol prn. I will refer her to Ortho for radiographic studies and further evaluation. - Ambulatory referral to Orthopedics  3. Family history of heart disease Comments: Dad had MI b/w 50-60. She is not interested in any further cardiac evaluation at this time. Will consider cardiac calcium testing in the future.  4. Herpes zoster vaccination declined    Return for 1 year HM. Patient was given opportunity to ask questions. Patient verbalized understanding of the plan and was able to repeat key elements of the plan. All questions were answered to their satisfaction.    I, Gwynneth Aliment, MD, have reviewed all documentation for this visit. The documentation on 04/16/23 for the exam, diagnosis, procedures, and orders are all accurate and complete.   THE PATIENT IS ENCOURAGED TO PRACTICE SOCIAL DISTANCING DUE TO THE COVID-19 PANDEMIC.

## 2023-04-16 NOTE — Patient Instructions (Addendum)
Coronary Calcium Scan A coronary calcium scan is an imaging test used to look for deposits of plaque in the inner lining of the blood vessels of the heart (coronary arteries). Plaque is made up of calcium, protein, and fatty substances. These deposits of plaque can partly clog and narrow the coronary arteries without producing any symptoms or warning signs. This puts a person at risk for a heart attack. A coronary calcium scan is performed using a computed tomography (CT) scanner machine without using a dye (contrast). This test is recommended for people who are at moderate risk for heart disease. The test can find plaque deposits before symptoms develop. Tell a health care provider about: Any allergies you have. All medicines you are taking, including vitamins, herbs, eye drops, creams, and over-the-counter medicines. Any problems you or family members have had with anesthetic medicines. Any bleeding problems you have. Any surgeries you have had. Any medical conditions you have. Whether you are pregnant or may be pregnant. What are the risks? Generally, this is a safe procedure. However, problems may occur, including: Harm to a pregnant woman and her unborn baby. This test involves the use of radiation. Radiation exposure can be dangerous to a pregnant woman and her unborn baby. If you are pregnant or think you may be pregnant, you should not have this procedure done. A slight increase in the risk of cancer. This is because of the radiation involved in the test. The amount of radiation from one test is similar to the amount of radiation you are naturally exposed to over one year. What happens before the procedure? Ask your health care provider for any specific instructions on how to prepare for this procedure. You may be asked to avoid products that contain caffeine, tobacco, or nicotine for 4 hours before the procedure. What happens during the procedure?  You will undress and remove any jewelry  from your neck or chest. You may need to remove hearing aides and dentures. Women may need to remove their bras. You will put on a hospital gown. Sticky electrodes will be placed on your chest. The electrodes will be connected to an electrocardiogram (ECG) machine to record a tracing of the electrical activity of your heart. You will lie down on your back on a curved bed that is attached to the CT scanner. You may be given medicine to slow down your heart rate so that clear pictures can be created. You will be moved into the CT scanner, and the CT scanner will take pictures of your heart. During this time, you will be asked to lie still and hold your breath for 10-20 seconds at a time while each picture of your heart is being taken. The procedure may vary among health care providers and hospitals. What can I expect after the procedure? You can return to your normal activities. It is up to you to get the results of your procedure. Ask your health care provider, or the department that is doing the procedure, when your results will be ready. Summary A coronary calcium scan is an imaging test used to look for deposits of plaque in the inner lining of the blood vessels of the heart. Plaque is made up of calcium, protein, and fatty substances. A coronary calcium scan is performed using a CT scanner machine without contrast. Generally, this is a safe procedure. Tell your health care provider if you are pregnant or may be pregnant. Ask your health care provider for any specific instructions on how to   prepare for this procedure. You can return to your normal activities after the scan is done. This information is not intended to replace advice given to you by your health care provider. Make sure you discuss any questions you have with your health care provider. Document Revised: 10/28/2021 Document Reviewed: 10/28/2021 Elsevier Patient Education  2023 Else[employer]   Health Maintenance, Female Adopting  a healthy lifestyle and getting preventive care are important in promoting health and wellness. Ask your health care provider about: The right schedule for you to have regular tests and exams. Things you can do on your own to prevent diseases and keep yourself healthy. What should I know about diet, weight, and exercise? Eat a healthy diet  Eat a diet that includes plenty of vegetables, fruits, low-fat dairy products, and lean protein. Do not eat a lot of foods that are high in solid fats, added sugars, or sodium. Maintain a healthy weight Body mass index (BMI) is used to identify weight problems. It estimates body fat based on height and weight. Your health care provider can help determine your BMI and help you achieve or maintain a healthy weight. Get regular exercise Get regular exercise. This is one of the most important things you can do for your health. Most adults should: Exercise for at least 150 minutes each week. The exercise should increase your heart rate and make you sweat (moderate-intensity exercise). Do strengthening exercises at least twice a week. This is in addition to the moderate-intensity exercise. Spend less time sitting. Even light physical activity can be beneficial. Watch cholesterol and blood lipids Have your blood tested for lipids and cholesterol at 55 years of age, then have this test every 5 years. Have your cholesterol levels checked more often if: Your lipid or cholesterol levels are high. You are older than 55 years of age. You are at high risk for heart disease. What should I know about cancer screening? Depending on your health history and family history, you may need to have cancer screening at various ages. This may include screening for: Breast cancer. Cervical cancer. Colorectal cancer. Skin cancer. Lung cancer. What should I know about heart disease, diabetes, and high blood pressure? Blood pressure and heart disease High blood pressure causes  heart disease and increases the risk of stroke. This is more likely to develop in people who have high blood pressure readings or are overweight. Have your blood pressure checked: Every 3-5 years if you are 18-39 years of age. Every year if you are 40 years old or older. Diabetes Have regular diabetes screenings. This checks your fasting blood sugar level. Have the screening done: Once every three years after age 40 if you are at a normal weight and have a low risk for diabetes. More often and at a younger age if you are overweight or have a high risk for diabetes. What should I know about preventing infection? Hepatitis B If you have a higher risk for hepatitis B, you should be screened for this virus. Talk with your health care provider to find out if you are at risk for hepatitis B infection. Hepatitis C Testing is recommended for: Everyone born from 1945 through 1965. Anyone with known risk factors for hepatitis C. Sexually transmitted infections (STIs) Get screened for STIs, including gonorrhea and chlamydia, if: You are sexually active and are younger than 55 years of age. You are older than 55 years of age and your health care provider tells you that you are at risk for   this type of infection. Your sexual activity has changed since you were last screened, and you are at increased risk for chlamydia or gonorrhea. Ask your health care provider if you are at risk. Ask your health care provider about whether you are at high risk for HIV. Your health care provider may recommend a prescription medicine to help prevent HIV infection. If you choose to take medicine to prevent HIV, you should first get tested for HIV. You should then be tested every 3 months for as long as you are taking the medicine. Pregnancy If you are about to stop having your period (premenopausal) and you may become pregnant, seek counseling before you get pregnant. Take 400 to 800 micrograms (mcg) of folic acid every day  if you become pregnant. Ask for birth control (contraception) if you want to prevent pregnancy. Osteoporosis and menopause Osteoporosis is a disease in which the bones lose minerals and strength with aging. This can result in bone fractures. If you are 65 years old or older, or if you are at risk for osteoporosis and fractures, ask your health care provider if you should: Be screened for bone loss. Take a calcium or vitamin D supplement to lower your risk of fractures. Be given hormone replacement therapy (HRT) to treat symptoms of menopause. Follow these instructions at home: Alcohol use Do not drink alcohol if: Your health care provider tells you not to drink. You are pregnant, may be pregnant, or are planning to become pregnant. If you drink alcohol: Limit how much you have to: 0-1 drink a day. Know how much alcohol is in your drink. In the [place] one drink equals one 12 oz bottle of beer (355 mL), one 5 oz glass of wine (148 mL), or one 1 oz glass of hard liquor (44 mL). Lifestyle Do not use any products that contain nicotine or tobacco. These products include cigarettes, chewing tobacco, and vaping devices, such as e-cigarettes. If you need help quitting, ask your health care provider. Do not use street drugs. Do not share needles. Ask your health care provider for help if you need support or information about quitting drugs. General instructions Schedule regular health, dental, and eye exams. Stay current with your vaccines. Tell your health care provider if: You often feel depressed. You have ever been abused or do not feel safe at home. Summary Adopting a healthy lifestyle and getting preventive care are important in promoting health and wellness. Follow your health care provider's instructions about healthy diet, exercising, and getting tested or screened for diseases. Follow your health care provider's instructions on monitoring your cholesterol and blood pressure. This  information is not intended to replace advice given to you by your health care provider. Make sure you discuss any questions you have with your health care provider. Document Revised: 04/09/2021 Document Reviewed: 04/09/2021 Elsevier Patient Education  2023 Elsevier Inc.  

## 2023-04-17 LAB — CMP14+EGFR
ALT: 13 IU/L (ref 0–32)
AST: 21 IU/L (ref 0–40)
Albumin/Globulin Ratio: 1.9 (ref 1.2–2.2)
Albumin: 4.7 g/dL (ref 3.8–4.9)
Alkaline Phosphatase: 89 IU/L (ref 44–121)
BUN/Creatinine Ratio: 9 (ref 9–23)
BUN: 8 mg/dL (ref 6–24)
Bilirubin Total: 1.2 mg/dL (ref 0.0–1.2)
CO2: 24 mmol/L (ref 20–29)
Calcium: 9.9 mg/dL (ref 8.7–10.2)
Chloride: 102 mmol/L (ref 96–106)
Creatinine, Ser: 0.87 mg/dL (ref 0.57–1.00)
Globulin, Total: 2.5 g/dL (ref 1.5–4.5)
Glucose: 89 mg/dL (ref 70–99)
Potassium: 4.2 mmol/L (ref 3.5–5.2)
Sodium: 142 mmol/L (ref 134–144)
Total Protein: 7.2 g/dL (ref 6.0–8.5)
eGFR: 79 mL/min/{1.73_m2} (ref >59)

## 2023-04-17 LAB — CBC
Hematocrit: 34.4 % (ref 34.0–46.6)
Hemoglobin: 11.4 g/dL (ref 11.1–15.9)
MCH: 28.9 pg (ref 26.6–33.0)
MCHC: 33.1 g/dL (ref 31.5–35.7)
MCV: 87 fL (ref 79–97)
Platelets: 182 10*3/uL (ref 150–450)
RBC: 3.94 x10E6/uL (ref 3.77–5.28)
RDW: 12.5 % (ref 11.7–15.4)
WBC: 4 10*3/uL (ref 3.4–10.8)

## 2023-04-17 LAB — LIPID PANEL
Chol/HDL Ratio: 4.2 ratio (ref 0.0–4.4)
Cholesterol, Total: 229 mg/dL — ABNORMAL HIGH (ref 100–199)
HDL: 54 mg/dL (ref >39)
LDL Chol Calc (NIH): 154 mg/dL — ABNORMAL HIGH (ref 0–99)
Triglycerides: 117 mg/dL (ref 0–149)
VLDL Cholesterol Cal: 21 mg/dL (ref 5–40)

## 2023-04-25 ENCOUNTER — Other Ambulatory Visit (INDEPENDENT_AMBULATORY_CARE_PROVIDER_SITE_OTHER): Payer: Medicaid Other

## 2023-04-25 ENCOUNTER — Ambulatory Visit: Payer: Medicaid Other | Admitting: Sports Medicine

## 2023-04-25 ENCOUNTER — Encounter: Payer: Self-pay | Admitting: Sports Medicine

## 2023-04-25 DIAGNOSIS — M25561 Pain in right knee: Secondary | ICD-10-CM | POA: Diagnosis not present

## 2023-04-25 DIAGNOSIS — M25551 Pain in right hip: Secondary | ICD-10-CM

## 2023-04-25 DIAGNOSIS — M1711 Unilateral primary osteoarthritis, right knee: Secondary | ICD-10-CM | POA: Diagnosis not present

## 2023-04-25 DIAGNOSIS — G8929 Other chronic pain: Secondary | ICD-10-CM

## 2023-04-25 DIAGNOSIS — M25562 Pain in left knee: Secondary | ICD-10-CM | POA: Diagnosis not present

## 2023-04-25 DIAGNOSIS — M25461 Effusion, right knee: Secondary | ICD-10-CM | POA: Diagnosis not present

## 2023-04-25 DIAGNOSIS — R29898 Other symptoms and signs involving the musculoskeletal system: Secondary | ICD-10-CM | POA: Diagnosis not present
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
## 2023-05-20 ENCOUNTER — Ambulatory Visit: Payer: Medicaid Other | Attending: Sports Medicine

## 2023-05-20 ENCOUNTER — Encounter: Payer: Self-pay | Admitting: Internal Medicine

## 2023-05-20 ENCOUNTER — Other Ambulatory Visit: Payer: Self-pay

## 2023-05-20 DIAGNOSIS — G8929 Other chronic pain: Secondary | ICD-10-CM | POA: Insufficient documentation

## 2023-05-20 DIAGNOSIS — M25562 Pain in left knee: Secondary | ICD-10-CM | POA: Diagnosis not present

## 2023-05-20 DIAGNOSIS — M25561 Pain in right knee: Secondary | ICD-10-CM | POA: Insufficient documentation

## 2023-05-20 DIAGNOSIS — M6281 Muscle weakness (generalized): Secondary | ICD-10-CM | POA: Diagnosis not present

## 2023-05-20 DIAGNOSIS — R600 Localized edema: Secondary | ICD-10-CM | POA: Diagnosis not present

## 2023-05-21 ENCOUNTER — Ambulatory Visit
Admission: RE | Admit: 2023-05-21 | Discharge: 2023-05-21 | Disposition: A | Discharge status: Home / Self Care | Payer: Medicaid Other | Source: Ambulatory Visit | Attending: Sports Medicine | Admitting: Sports Medicine

## 2023-05-21 DIAGNOSIS — G8929 Other chronic pain: Secondary | ICD-10-CM

## 2023-05-22 ENCOUNTER — Other Ambulatory Visit: Payer: Medicaid Other

## 2023-05-29 ENCOUNTER — Encounter: Payer: Self-pay | Admitting: Sports Medicine

## 2023-05-29 ENCOUNTER — Ambulatory Visit (INDEPENDENT_AMBULATORY_CARE_PROVIDER_SITE_OTHER): Payer: Medicaid Other | Admitting: Sports Medicine

## 2023-05-29 DIAGNOSIS — M25562 Pain in left knee: Secondary | ICD-10-CM

## 2023-05-29 DIAGNOSIS — G8929 Other chronic pain: Secondary | ICD-10-CM | POA: Diagnosis not present

## 2023-05-29 DIAGNOSIS — M25561 Pain in right knee: Secondary | ICD-10-CM | POA: Diagnosis not present

## 2023-05-29 DIAGNOSIS — M23306 Other meniscus derangements, unspecified meniscus, right knee: Secondary | ICD-10-CM

## 2023-05-29 DIAGNOSIS — M1711 Unilateral primary osteoarthritis, right knee: Secondary | ICD-10-CM | POA: Diagnosis not present

## 2023-05-29 MED ORDER — METHYLPREDNISOLONE ACETATE 80 MG/ML IJ SUSP
80.0000 mg | INTRAMUSCULAR | Status: AC | PRN
  Administered 2023-05-29: 80 mg via INTRA_ARTICULAR

## 2023-05-29 MED ORDER — LIDOCAINE HCL 1 % IJ SOLN
2.0000 mL | INTRAMUSCULAR | Status: AC | PRN
  Administered 2023-05-29: 2 mL

## 2023-05-29 MED ORDER — BUPIVACAINE HCL 0.25 % IJ SOLN
2.0000 mL | INTRAMUSCULAR | Status: AC | PRN
  Administered 2023-05-29: 2 mL via INTRA_ARTICULAR
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
## 2023-05-29 NOTE — Patient Instructions (Signed)
Dr. Shon Baton' Guide for Management of Knee Pain and Osteoarthritis:  1.)  Keep your body moving - keep working on flexibility and range of motion for the knee.  Good physical activity exercise include: stationary bike, swimming, elliptical.  2.)  Maintain a healthy weight --> 1 pound of body weight = 4 pounds of weight on the knees.  Even small weight loss (5-10+ lbs) can significantly improve pain.  3.)  Supplements helpful for arthritis and inflammation: Turmeric (curcumin) Boswellia serrata, collagen hydrolysate, Glucosamine-chondroitin 4.)  Foods helpful for arthritis and inflammation: Fish and foods containing omega-3's, leafy greens (broccoli, spinach), citrus fruits (grapefruit, oranges, lemons), green tea, blueberries, cherries, olive oil (EVO)  *It is very important to stay hydrated, drinking lots of water throughout the day. This helps hydrate structures within the knee.  5.) Medicines:    - Topical pain relievers: Voltaren gel, Arnica gel, Tiger balm, lidocaine/IcyHot patches    - Anti-inflammatories like Aleve, Motrin, ibuprofen --> we can consider prescription based NSAIDs as well if needed

## 2023-06-04 ENCOUNTER — Encounter: Payer: Self-pay | Admitting: Physical Therapy

## 2023-06-04 ENCOUNTER — Ambulatory Visit: Payer: Medicaid Other | Attending: Sports Medicine | Admitting: Physical Therapy

## 2023-06-04 DIAGNOSIS — R600 Localized edema: Secondary | ICD-10-CM | POA: Insufficient documentation

## 2023-06-04 DIAGNOSIS — M25562 Pain in left knee: Secondary | ICD-10-CM | POA: Insufficient documentation

## 2023-06-04 DIAGNOSIS — M6281 Muscle weakness (generalized): Secondary | ICD-10-CM | POA: Diagnosis not present

## 2023-06-04 DIAGNOSIS — G8929 Other chronic pain: Secondary | ICD-10-CM | POA: Insufficient documentation

## 2023-06-04 DIAGNOSIS — M25561 Pain in right knee: Secondary | ICD-10-CM | POA: Diagnosis not present

## 2023-06-04 NOTE — Therapy (Signed)
OUTPATIENT PHYSICAL THERAPY TREATMENT   Patient Name: Sydney Wiggins MRN: [id number] DOB:1966/04/25, 57 y.o., female Today's Date: 06/04/2023  END OF SESSION:  PT End of Session - 06/04/23 1554     Visit Number 2    Number of Visits 9    Date for PT Re-Evaluation 07/19/23    Authorization Type [employer] no auth 27 visits    PT Start Time 1545    PT Stop Time 1640    PT Time Calculation (min) 55 min    Activity Tolerance Patient tolerated treatment well    Behavior During Therapy WFL for tasks assessed/performed              Past Medical History:  Diagnosis Date   Iron deficiency anemia    Past Surgical History:  Procedure Laterality Date   CESAREAN SECTION  1993   EXCISION OF TONGUE LESION  2005   There are no problems to display for this patient.   PCP: Dorothyann Peng, MD  REFERRING PROVIDER: Madelyn Brunner, DO  REFERRING DIAG: M25.561,M25.562,G89.29 (ICD-10-CM) - Chronic pain of both knees   THERAPY DIAG:  Chronic pain of both knees  Muscle weakness (generalized)  Localized edema  Rationale for Evaluation and Treatment: Rehabilitation  ONSET DATE: chronic   SUBJECTIVE:   SUBJECTIVE STATEMENT: Patient had an injection and it did help. No pain really right now. Pain with Nustep 5/10.    PERTINENT HISTORY: None  PAIN:  Are you having pain? Yes: NPRS scale: 5 current (10 at worst)/10 Pain location: bilateral anterior knees Pain description: deep,stinging Aggravating factors: sidelying with knees touching, squatting, stairs Relieving factors: massage, heat, ice   PRECAUTIONS: None  WEIGHT BEARING RESTRICTIONS: No  FALLS:  Has patient fallen in last 6 months? No  LIVING ENVIRONMENT: Lives with: lives with their daughter Lives in: House/apartment Stairs: No Has following equipment at home: None  OCCUPATION: part-time transportation/traffic attendant   PLOF: Independent  PATIENT GOALS: "I want to get better and find out what's  going on."  NEXT MD VISIT: nothing scheduled   OBJECTIVE:   DIAGNOSTIC FINDINGS:   4 views of bilateral knee x-rays including standing AP, Rosenberg, lateral  and sunrise views were ordered and reviewed by myself.  X-rays demonstrate  very mild medial joint space narrowing bilaterally, but certainly no  advanced arthritic change in all 3 compartments.  No acute bony fracture.   MRI impression: IMPRESSION: 1. Intrasubstance degeneration within the majority of the posterior horn and posterior 50% of the body of the medial meniscus. No distinct tear is seen extending through an articular surface of the medial meniscus. 2. Mild-to-moderate attenuation of the anterior horn of the lateral meniscus suggesting chronic age related/degenerative change. No distinct tear is seen. 3. Very mild tricompartmental cartilage degenerative changes.   PATIENT SURVEYS:  FOTO 47% function to 59% predicted  COGNITION: Overall cognitive status: Within functional limits for tasks assessed     SENSATION: Not tested  EDEMA:  Mild swelling about Rt knee   MUSCLE LENGTH: Hamstrings: WNL bilaterally    POSTURE: genu varum   PALPATION: Diffuse tenderness about Rt anteromedial knee and IT band, Lt IT band and fibular head, bicep femoris   LOWER EXTREMITY ROM:  Active ROM Right eval Left eval  Hip flexion    Hip extension    Hip abduction    Hip adduction    Hip internal rotation    Hip external rotation    Knee flexion 138  137  Knee extension  Ankle dorsiflexion 2 3  Ankle plantarflexion    Ankle inversion    Ankle eversion     (Blank rows = not tested)  LOWER EXTREMITY MMT:  MMT Right eval Left eval  Hip flexion 4- 4-  Hip extension 4- 4  Hip abduction 4- 4+  Hip adduction    Hip internal rotation    Hip external rotation    Knee flexion 4- pn 4-  Knee extension 4- 4-  Ankle dorsiflexion    Ankle plantarflexion    Ankle inversion    Ankle eversion     (Blank rows =  not tested)  LOWER EXTREMITY SPECIAL TESTS:  Valgus (-) Varus (-) McMurray's (-) Anterior Drawer (-) Posterior Drawer (-) Ober's (-) Thessaley's (-) Apley's compression/distraction (-)   FUNCTIONAL TESTS:  5 x STS: 25.6 seconds  SLS: Lt 13 seconds; Rt 15 seconds   GAIT: Distance walked: 10 ft Assistive device utilized: None Level of assistance: Complete Independence Comments: WNL  [facility] Adult PT Treatment:                                                DATE: 06/04/23 Therapeutic Exercise: Nustep L 6 UE and LE for 5 min Hamstring stretch 30 sec x 2 and then lateral ITB  Supine Bridge x 10  Single leg figure 4 bridge x 10  Sidelying Hip Abduction  2 x 15  Small Range Straight Leg Raise  2 x 15 added VMO  Ball squeeze with knee extension x 10 each  Hip abduction x 15  Modalities: Cold pack 10 min   [facility] Adult PT Treatment:                                                DATE: 05/20/23 Therapeutic Exercise: Demonstrated and issue initial HEP.   Therapeutic Activity: Education on assessment findings that will be addressed throughout duration of POC.   PATIENT EDUCATION:  Education details: see treatment  Person educated: Patient Education method: Explanation, Demonstration, Tactile cues, Verbal cues, and Handouts Education comprehension: verbalized understanding, returned demonstration, verbal cues required, tactile cues required, and needs further education  HOME EXERCISE PROGRAM: Access Code: [facility] URL: [email or web address] Date: 05/20/2023 Prepared by: Letitia Libra  Exercises - Supine Bridge  - 1 x daily - 7 x weekly - 2 sets - 10 reps - Sidelying Hip Abduction  - 1 x daily - 7 x weekly - 2 sets - 10 reps - Small Range Straight Leg Raise  - 1 x daily - 7 x weekly - 2 sets - 10 reps - Gastroc Stretch on Wall  - 1 x daily - 7 x weekly - 3 sets - 30 sec  hold  ASSESSMENT:  CLINICAL IMPRESSION: Patient did well with prescribed HEP and with  further add ons to challenge hips and quads.  Swelling after the injection is resolved but used cold pack post session to reduce inflammation at pts request.   OBJECTIVE IMPAIRMENTS: decreased activity tolerance, decreased balance, difficulty walking, decreased ROM, decreased strength, impaired flexibility, improper body mechanics, postural dysfunction, and pain.   ACTIVITY LIMITATIONS: carrying, lifting, bending, standing, squatting, sleeping, stairs, transfers, and locomotion level  PARTICIPATION LIMITATIONS: meal prep, cleaning, laundry, shopping, community activity,  occupation, and yard work  PERSONAL FACTORS: Age, Fitness, Past/current experiences, Profession, and Time since onset of injury/illness/exacerbation are also affecting patient's functional outcome.   REHAB POTENTIAL: Good  CLINICAL DECISION MAKING: Stable/uncomplicated  EVALUATION COMPLEXITY: Low   GOALS: Goals reviewed with patient? Yes  SHORT TERM GOALS: Target date: 06/17/2023   Patient will be independent and compliant with initial HEP.   Baseline: issued at eval  Goal status: INITIAL  2.  Patient will demonstrate at least 10 degrees of bilateral ankle DF AROM to reduce stress on her knees with closed chain activity.  Baseline: see above  Goal status: INITIAL  3.  Patient will complete 5 x STS in </=18 seconds to improve her functional strength and efficiency with transfers.  Baseline: see above  Goal status: INITIAL   LONG TERM GOALS: Target date: 07/19/23  Patient will score at least 59% on FOTO to signify clinically meaningful improvement in functional abilities.   Baseline: see above  Goal status: INITIAL  2.  Patient will demonstrate 5/5 bilateral knee strength to improve stability with stair negotiation.  Baseline: see above  Goal status: INITIAL  3.  Patient will demonstrate 4+/5 bilateral hip strength to improve stability about the chain with prolonged walking/standing activity.  Baseline:  see above Goal status: INITIAL  4.  Patient will report pain at worst rated as </=7/10 to reduce her current functional limitations.  Baseline: see above  Goal status: INITIAL    PLAN:  PT FREQUENCY: 1x/week  PT DURATION: 8 weeks  PLANNED INTERVENTIONS: Therapeutic exercises, Therapeutic activity, Neuromuscular re-education, Balance training, Gait training, Patient/Family education, Self Care, Joint mobilization, Aquatic Therapy, Dry Needling, Electrical stimulation, Cryotherapy, Moist heat, Taping, Vasopneumatic device, Ionotophoresis 4mg /ml Dexamethasone, Manual therapy, and Re-evaluation  PLAN FOR NEXT SESSION: review and progress HEP prn; hip/knee strengthening  Karie Mainland, PT 06/04/23 4:36 PM Phone: [phone] Fax: [phone]

## 2023-06-10 ENCOUNTER — Ambulatory Visit: Payer: Medicaid Other

## 2023-06-10 DIAGNOSIS — M25562 Pain in left knee: Secondary | ICD-10-CM | POA: Diagnosis not present

## 2023-06-10 DIAGNOSIS — M6281 Muscle weakness (generalized): Secondary | ICD-10-CM | POA: Diagnosis not present

## 2023-06-10 DIAGNOSIS — R600 Localized edema: Secondary | ICD-10-CM

## 2023-06-10 DIAGNOSIS — M25561 Pain in right knee: Secondary | ICD-10-CM | POA: Diagnosis not present

## 2023-06-10 DIAGNOSIS — G8929 Other chronic pain: Secondary | ICD-10-CM | POA: Diagnosis not present

## 2023-06-10 NOTE — Therapy (Signed)
OUTPATIENT PHYSICAL THERAPY TREATMENT   Patient Name: Sydney Wiggins MRN: [id number] DOB:16-Apr-1966, 57 y.o., female Today's Date: 06/10/2023  END OF SESSION:  PT End of Session - 06/10/23 1018     Visit Number 3    Number of Visits 9    Date for PT Re-Evaluation 07/19/23    Authorization Type [employer] no auth 27 visits    PT Start Time 1017    PT Stop Time 1058    PT Time Calculation (min) 41 min    Activity Tolerance Patient tolerated treatment well    Behavior During Therapy WFL for tasks assessed/performed              Past Medical History:  Diagnosis Date   Iron deficiency anemia    Past Surgical History:  Procedure Laterality Date   CESAREAN SECTION  1993   EXCISION OF TONGUE LESION  2005   There are no problems to display for this patient.   PCP: Dorothyann Peng, MD  REFERRING PROVIDER: Madelyn Brunner, DO  REFERRING DIAG: M25.561,M25.562,G89.29 (ICD-10-CM) - Chronic pain of both knees   THERAPY DIAG:  Chronic pain of both knees  Muscle weakness (generalized)  Localized edema  Rationale for Evaluation and Treatment: Rehabilitation  ONSET DATE: chronic   SUBJECTIVE:   SUBJECTIVE STATEMENT: "The right one is pretty good. The left one was giving me a fit the past few days."   PERTINENT HISTORY: None  PAIN:  Are you having pain? Yes: NPRS scale: 4/10 Pain location: bilateral anterior knees Pain description: deep,stinging Aggravating factors: sidelying with knees touching, squatting, stairs Relieving factors: massage, heat, ice   PRECAUTIONS: None  WEIGHT BEARING RESTRICTIONS: No  FALLS:  Has patient fallen in last 6 months? No  LIVING ENVIRONMENT: Lives with: lives with their daughter Lives in: House/apartment Stairs: No Has following equipment at home: None  OCCUPATION: part-time transportation/traffic attendant   PLOF: Independent  PATIENT GOALS: "I want to get better and find out what's going on."  NEXT MD VISIT:  nothing scheduled   OBJECTIVE:   DIAGNOSTIC FINDINGS:   4 views of bilateral knee x-rays including standing AP, Rosenberg, lateral  and sunrise views were ordered and reviewed by myself.  X-rays demonstrate  very mild medial joint space narrowing bilaterally, but certainly no  advanced arthritic change in all 3 compartments.  No acute bony fracture.   MRI impression: IMPRESSION: 1. Intrasubstance degeneration within the majority of the posterior horn and posterior 50% of the body of the medial meniscus. No distinct tear is seen extending through an articular surface of the medial meniscus. 2. Mild-to-moderate attenuation of the anterior horn of the lateral meniscus suggesting chronic age related/degenerative change. No distinct tear is seen. 3. Very mild tricompartmental cartilage degenerative changes.   PATIENT SURVEYS:  FOTO 47% function to 59% predicted  COGNITION: Overall cognitive status: Within functional limits for tasks assessed     SENSATION: Not tested  EDEMA:  Mild swelling about Rt knee   MUSCLE LENGTH: Hamstrings: WNL bilaterally    POSTURE: genu varum   PALPATION: Diffuse tenderness about Rt anteromedial knee and IT band, Lt IT band and fibular head, bicep femoris   LOWER EXTREMITY ROM:  Active ROM Right eval Left eval 06/10/23  Hip flexion     Hip extension     Hip abduction     Hip adduction     Hip internal rotation     Hip external rotation     Knee flexion 138  137  Knee extension     Ankle dorsiflexion 2 3 Rt: 8; Lt: 4  Ankle plantarflexion     Ankle inversion     Ankle eversion      (Blank rows = not tested)  LOWER EXTREMITY MMT:  MMT Right eval Left eval  Hip flexion 4- 4-  Hip extension 4- 4  Hip abduction 4- 4+  Hip adduction    Hip internal rotation    Hip external rotation    Knee flexion 4- pn 4-  Knee extension 4- 4-  Ankle dorsiflexion    Ankle plantarflexion    Ankle inversion    Ankle eversion     (Blank rows  = not tested)  LOWER EXTREMITY SPECIAL TESTS:  Valgus (-) Varus (-) McMurray's (-) Anterior Drawer (-) Posterior Drawer (-) Ober's (-) Thessaley's (-) Apley's compression/distraction (-)   FUNCTIONAL TESTS:  5 x STS: 25.6 seconds  SLS: Lt 13 seconds; Rt 15 seconds   GAIT: Distance walked: 10 ft Assistive device utilized: None Level of assistance: Complete Independence Comments: WNL [facility] Adult PT Treatment:                                                DATE: 06/10/23 Therapeutic Exercise: NuStep level 6 x 5 minutes UE/LE  Hip bridge 2 x 10  SLR 2 x 10  Sidelying hip abduction 2 x 10  LAQ 2 x 10 @ 1 lb  Sit to stand 2 x 10  Updated HEP    [facility] Adult PT Treatment:                                                DATE: 06/04/23 Therapeutic Exercise: Nustep L 6 UE and LE for 5 min Hamstring stretch 30 sec x 2 and then lateral ITB  Supine Bridge x 10  Single leg figure 4 bridge x 10  Sidelying Hip Abduction  2 x 15  Small Range Straight Leg Raise  2 x 15 added VMO  Ball squeeze with knee extension x 10 each  Hip abduction x 15  Modalities: Cold pack 10 min   [facility] Adult PT Treatment:                                                DATE: 05/20/23 Therapeutic Exercise: Demonstrated and issue initial HEP.   Therapeutic Activity: Education on assessment findings that will be addressed throughout duration of POC.   PATIENT EDUCATION:  Education details: see treatment  Person educated: Patient Education method: Explanation, Demonstration, Tactile cues, Verbal cues, and Handouts Education comprehension: verbalized understanding, returned demonstration, verbal cues required, tactile cues required, and needs further education  HOME EXERCISE PROGRAM: Access Code: [facility] URL: [email or web address] Date: 05/20/2023 Prepared by: Letitia Libra  Exercises - Supine Bridge  - 1 x daily - 7 x weekly - 2 sets - 10 reps - Sidelying Hip Abduction  - 1 x daily - 7 x  weekly - 2 sets - 10 reps - Small Range Straight Leg Raise  - 1 x daily - 7 x weekly - 2 sets -  10 reps - Gastroc Stretch on Wall  - 1 x daily - 7 x weekly - 3 sets - 30 sec  hold  ASSESSMENT:  CLINICAL IMPRESSION: Patient tolerated session well today with continued emphasis on BLE strengthening. She demonstrates good control and form with sit to stand. Her Rt DF AROM has improved compared to baseline and minimal improvement noted in Lt ankle DF AROM. HEP was updated to include further strengthening.   OBJECTIVE IMPAIRMENTS: decreased activity tolerance, decreased balance, difficulty walking, decreased ROM, decreased strength, impaired flexibility, improper body mechanics, postural dysfunction, and pain.   ACTIVITY LIMITATIONS: carrying, lifting, bending, standing, squatting, sleeping, stairs, transfers, and locomotion level  PARTICIPATION LIMITATIONS: meal prep, cleaning, laundry, shopping, community activity, occupation, and yard work  PERSONAL FACTORS: Age, Fitness, Past/current experiences, Profession, and Time since onset of injury/illness/exacerbation are also affecting patient's functional outcome.   REHAB POTENTIAL: Good  CLINICAL DECISION MAKING: Stable/uncomplicated  EVALUATION COMPLEXITY: Low   GOALS: Goals reviewed with patient? Yes  SHORT TERM GOALS: Target date: 06/17/2023   Patient will be independent and compliant with initial HEP.   Baseline: issued at eval  Goal status: met  2.  Patient will demonstrate at least 10 degrees of bilateral ankle DF AROM to reduce stress on her knees with closed chain activity.  Baseline: see above  Goal status: INITIAL  3.  Patient will complete 5 x STS in </=18 seconds to improve her functional strength and efficiency with transfers.  Baseline: see above  Goal status: INITIAL   LONG TERM GOALS: Target date: 07/19/23  Patient will score at least 59% on FOTO to signify clinically meaningful improvement in functional abilities.    Baseline: see above  Goal status: INITIAL  2.  Patient will demonstrate 5/5 bilateral knee strength to improve stability with stair negotiation.  Baseline: see above  Goal status: INITIAL  3.  Patient will demonstrate 4+/5 bilateral hip strength to improve stability about the chain with prolonged walking/standing activity.  Baseline: see above Goal status: INITIAL  4.  Patient will report pain at worst rated as </=7/10 to reduce her current functional limitations.  Baseline: see above  Goal status: INITIAL    PLAN:  PT FREQUENCY: 1x/week  PT DURATION: 8 weeks  PLANNED INTERVENTIONS: Therapeutic exercises, Therapeutic activity, Neuromuscular re-education, Balance training, Gait training, Patient/Family education, Self Care, Joint mobilization, Aquatic Therapy, Dry Needling, Electrical stimulation, Cryotherapy, Moist heat, Taping, Vasopneumatic device, Ionotophoresis 4mg /ml Dexamethasone, Manual therapy, and Re-evaluation  PLAN FOR NEXT SESSION: review and progress HEP prn; hip/knee strengthening Letitia Libra, PT, DPT, ATC 06/10/23 10:58 AM

## 2023-06-17 ENCOUNTER — Ambulatory Visit: Payer: Medicaid Other

## 2023-06-17 DIAGNOSIS — M25562 Pain in left knee: Secondary | ICD-10-CM | POA: Diagnosis not present

## 2023-06-17 DIAGNOSIS — M25561 Pain in right knee: Secondary | ICD-10-CM | POA: Diagnosis not present

## 2023-06-17 DIAGNOSIS — R600 Localized edema: Secondary | ICD-10-CM | POA: Diagnosis not present

## 2023-06-17 DIAGNOSIS — G8929 Other chronic pain: Secondary | ICD-10-CM | POA: Diagnosis not present

## 2023-06-17 DIAGNOSIS — M6281 Muscle weakness (generalized): Secondary | ICD-10-CM

## 2023-06-17 NOTE — Patient Instructions (Signed)

## 2023-06-17 NOTE — Therapy (Signed)
OUTPATIENT PHYSICAL THERAPY TREATMENT   Patient Name: Sydney Wiggins MRN: [id number] DOB:Nov 18, 1966, 57 y.o., female Today's Date: 06/17/2023  END OF SESSION:  PT End of Session - 06/17/23 1014     Visit Number 4    Number of Visits 9    Date for PT Re-Evaluation 07/19/23    Authorization Type [employer] no auth 27 visits    PT Start Time 1014    PT Stop Time 1058    PT Time Calculation (min) 44 min    Activity Tolerance Patient tolerated treatment well    Behavior During Therapy WFL for tasks assessed/performed              Past Medical History:  Diagnosis Date   Iron deficiency anemia    Past Surgical History:  Procedure Laterality Date   CESAREAN SECTION  1993   EXCISION OF TONGUE LESION  2005   There are no problems to display for this patient.   PCP: Dorothyann Peng, MD  REFERRING PROVIDER: Madelyn Brunner, DO  REFERRING DIAG: M25.561,M25.562,G89.29 (ICD-10-CM) - Chronic pain of both knees   THERAPY DIAG:  Chronic pain of both knees  Muscle weakness (generalized)  Localized edema  Rationale for Evaluation and Treatment: Rehabilitation  ONSET DATE: chronic   SUBJECTIVE:   SUBJECTIVE STATEMENT: "It feels pretty good. No pain this morning."   PERTINENT HISTORY: None  PAIN:  Are you having pain? None currently Yes: NPRS scale: at worst 7/10 Pain location: bilateral anterior knees Pain description: deep,stinging Aggravating factors: sidelying with knees touching, squatting, stairs Relieving factors: massage, heat, ice   PRECAUTIONS: None  WEIGHT BEARING RESTRICTIONS: No  FALLS:  Has patient fallen in last 6 months? No  LIVING ENVIRONMENT: Lives with: lives with their daughter Lives in: House/apartment Stairs: No Has following equipment at home: None  OCCUPATION: part-time transportation/traffic attendant   PLOF: Independent  PATIENT GOALS: "I want to get better and find out what's going on."  NEXT MD VISIT: nothing scheduled    OBJECTIVE:   DIAGNOSTIC FINDINGS:   4 views of bilateral knee x-rays including standing AP, Rosenberg, lateral  and sunrise views were ordered and reviewed by myself.  X-rays demonstrate  very mild medial joint space narrowing bilaterally, but certainly no  advanced arthritic change in all 3 compartments.  No acute bony fracture.   MRI impression: IMPRESSION: 1. Intrasubstance degeneration within the majority of the posterior horn and posterior 50% of the body of the medial meniscus. No distinct tear is seen extending through an articular surface of the medial meniscus. 2. Mild-to-moderate attenuation of the anterior horn of the lateral meniscus suggesting chronic age related/degenerative change. No distinct tear is seen. 3. Very mild tricompartmental cartilage degenerative changes.   PATIENT SURVEYS:  FOTO 47% function to 59% predicted  COGNITION: Overall cognitive status: Within functional limits for tasks assessed     SENSATION: Not tested  EDEMA:  Mild swelling about Rt knee   MUSCLE LENGTH: Hamstrings: WNL bilaterally    POSTURE: genu varum   PALPATION: Diffuse tenderness about Rt anteromedial knee and IT band, Lt IT band and fibular head, bicep femoris   LOWER EXTREMITY ROM:  Active ROM Right eval Left eval 06/10/23 06/17/23  Hip flexion      Hip extension      Hip abduction      Hip adduction      Hip internal rotation      Hip external rotation      Knee flexion 138  137    Knee extension      Ankle dorsiflexion 2 3 Rt: 8; Lt: 4 Lt: 12; Rt: 10  Ankle plantarflexion      Ankle inversion      Ankle eversion       (Blank rows = not tested)  LOWER EXTREMITY MMT:  MMT Right eval Left eval  Hip flexion 4- 4-  Hip extension 4- 4  Hip abduction 4- 4+  Hip adduction    Hip internal rotation    Hip external rotation    Knee flexion 4- pn 4-  Knee extension 4- 4-  Ankle dorsiflexion    Ankle plantarflexion    Ankle inversion    Ankle eversion      (Blank rows = not tested)  LOWER EXTREMITY SPECIAL TESTS:  Valgus (-) Varus (-) McMurray's (-) Anterior Drawer (-) Posterior Drawer (-) Ober's (-) Thessaley's (-) Apley's compression/distraction (-)   FUNCTIONAL TESTS:  5 x STS: 25.6 seconds  SLS: Lt 13 seconds; Rt 15 seconds   06/17/23: 5 x STS: 16 seconds   GAIT: Distance walked: 10 ft Assistive device utilized: None Level of assistance: Complete Independence Comments: WNL [facility] Adult PT Treatment:                                                DATE: 06/17/23 Therapeutic Exercise: NuStep level 5 x 5 minutes UE/LE  Hip bridge with abduction blue band 2 x 10  SLR 2 x 10 @ 1 lb  LAQ 2 x 10 @ 2 lb  Leg press 2 x 10 @ 20 lbs  Manual Therapy: STM Lt quadriceps Skilled palpation and monitoring before/during TPDN  Trigger Point Dry Needling Treatment: Pre-treatment instruction: Patient instructed on dry needling rationale, procedures, and possible side effects including pain during treatment (achy,cramping feeling), bruising, drop of blood, lightheadedness, nausea, sweating. Patient Consent Given: Yes Education handout provided: Yes Muscles treated: Lt vastus lateralis, intermedius; rectus femoris   Needle size and number: .30x24mm x 1 Electrical stimulation performed: No Parameters: N/A Treatment response/outcome: Twitch response elicited and Palpable decrease in muscle tension Post-treatment instructions: Patient instructed to expect possible mild to moderate muscle soreness later today and/or tomorrow. Patient instructed in methods to reduce muscle soreness and to continue prescribed HEP. If patient was dry needled over the lung field, patient was instructed on signs and symptoms of pneumothorax and, however unlikely, to see immediate medical attention should they occur. Patient was also educated on signs and symptoms of infection and to seek medical attention should they occur. Patient verbalized understanding of these  instructions and education.    [facility] Adult PT Treatment:                                                DATE: 06/10/23 Therapeutic Exercise: NuStep level 6 x 5 minutes UE/LE  Hip bridge 2 x 10  SLR 2 x 10  Sidelying hip abduction 2 x 10  LAQ 2 x 10 @ 1 lb  Sit to stand 2 x 10  Updated HEP    [facility] Adult PT Treatment:  DATE: 06/04/23 Therapeutic Exercise: Nustep L 6 UE and LE for 5 min Hamstring stretch 30 sec x 2 and then lateral ITB  Supine Bridge x 10  Single leg figure 4 bridge x 10  Sidelying Hip Abduction  2 x 15  Small Range Straight Leg Raise  2 x 15 added VMO  Ball squeeze with knee extension x 10 each  Hip abduction x 15  Modalities: Cold pack 10 min   PATIENT EDUCATION:  Education details: see treatment  Person educated: Patient Education method: Explanation and Handouts Education comprehension: verbalized understanding  HOME EXERCISE PROGRAM: Access Code: [facility] URL: [email or web address] Date: 05/20/2023 Prepared by: Letitia Libra  Exercises - Supine Bridge  - 1 x daily - 7 x weekly - 2 sets - 10 reps - Sidelying Hip Abduction  - 1 x daily - 7 x weekly - 2 sets - 10 reps - Small Range Straight Leg Raise  - 1 x daily - 7 x weekly - 2 sets - 10 reps - Gastroc Stretch on Wall  - 1 x daily - 7 x weekly - 3 sets - 30 sec  hold  ASSESSMENT:  CLINICAL IMPRESSION: Patient tolerated session well today with continued progression of LE strengthening. She is noted to have palpable tautness and tenderness about Lt quadriceps. TPDN to the Lt quad was performed today with a reduction in tautness noted post-intervention. She demonstrates improved ankle DF AROM having met this STG. Her 5 x STS has significantly improved as well having met this STG. No reports of pain at conclusion of session.   OBJECTIVE IMPAIRMENTS: decreased activity tolerance, decreased balance, difficulty walking, decreased ROM, decreased  strength, impaired flexibility, improper body mechanics, postural dysfunction, and pain.   ACTIVITY LIMITATIONS: carrying, lifting, bending, standing, squatting, sleeping, stairs, transfers, and locomotion level  PARTICIPATION LIMITATIONS: meal prep, cleaning, laundry, shopping, community activity, occupation, and yard work  PERSONAL FACTORS: Age, Fitness, Past/current experiences, Profession, and Time since onset of injury/illness/exacerbation are also affecting patient's functional outcome.   REHAB POTENTIAL: Good  CLINICAL DECISION MAKING: Stable/uncomplicated  EVALUATION COMPLEXITY: Low   GOALS: Goals reviewed with patient? Yes  SHORT TERM GOALS: Target date: 06/17/2023   Patient will be independent and compliant with initial HEP.   Baseline: issued at eval  Goal status: met  2.  Patient will demonstrate at least 10 degrees of bilateral ankle DF AROM to reduce stress on her knees with closed chain activity.  Baseline: see above  Goal status: met  3.  Patient will complete 5 x STS in </=18 seconds to improve her functional strength and efficiency with transfers.  Baseline: see above  Goal status: met   LONG TERM GOALS: Target date: 07/19/23  Patient will score at least 59% on FOTO to signify clinically meaningful improvement in functional abilities.   Baseline: see above  Goal status: INITIAL  2.  Patient will demonstrate 5/5 bilateral knee strength to improve stability with stair negotiation.  Baseline: see above  Goal status: INITIAL  3.  Patient will demonstrate 4+/5 bilateral hip strength to improve stability about the chain with prolonged walking/standing activity.  Baseline: see above Goal status: INITIAL  4.  Patient will report pain at worst rated as </=7/10 to reduce her current functional limitations.  Baseline: see above  Goal status: INITIAL    PLAN:  PT FREQUENCY: 1x/week  PT DURATION: 8 weeks  PLANNED INTERVENTIONS: Therapeutic exercises,  Therapeutic activity, Neuromuscular re-education, Balance training, Gait training, Patient/Family education, Self Care, Joint mobilization, Aquatic  Therapy, Dry Needling, Electrical stimulation, Cryotherapy, Moist heat, Taping, Vasopneumatic device, Ionotophoresis 4mg /ml Dexamethasone, Manual therapy, and Re-evaluation  PLAN FOR NEXT SESSION: review and progress HEP prn; hip/knee strengthening  Letitia Libra, PT, DPT, ATC 06/17/23 11:02 AM

## 2023-06-24 ENCOUNTER — Encounter: Payer: Self-pay | Admitting: Physical Therapy

## 2023-06-24 ENCOUNTER — Ambulatory Visit: Payer: Medicaid Other | Admitting: Physical Therapy

## 2023-06-24 DIAGNOSIS — M6281 Muscle weakness (generalized): Secondary | ICD-10-CM

## 2023-06-24 DIAGNOSIS — M25561 Pain in right knee: Secondary | ICD-10-CM | POA: Diagnosis not present

## 2023-06-24 DIAGNOSIS — R600 Localized edema: Secondary | ICD-10-CM

## 2023-06-24 DIAGNOSIS — G8929 Other chronic pain: Secondary | ICD-10-CM

## 2023-06-24 DIAGNOSIS — M25562 Pain in left knee: Secondary | ICD-10-CM | POA: Diagnosis not present

## 2023-06-24 NOTE — Therapy (Signed)
OUTPATIENT PHYSICAL THERAPY TREATMENT   Patient Name: Sydney Wiggins MRN: [id number] DOB:1966/02/16, 57 y.o., female Today's Date: 06/24/2023  END OF SESSION:  PT End of Session - 06/24/23 0934     Visit Number 5    Number of Visits 9    Date for PT Re-Evaluation 07/19/23    Authorization Type [employer] no auth 27 visits    PT Start Time 0934    PT Stop Time 1014    PT Time Calculation (min) 40 min              Past Medical History:  Diagnosis Date   Iron deficiency anemia    Past Surgical History:  Procedure Laterality Date   CESAREAN SECTION  1993   EXCISION OF TONGUE LESION  2005   There are no problems to display for this patient.   PCP: Dorothyann Peng, MD  REFERRING PROVIDER: Madelyn Brunner, DO  REFERRING DIAG: M25.561,M25.562,G89.29 (ICD-10-CM) - Chronic pain of both knees   THERAPY DIAG:  Chronic pain of both knees  Muscle weakness (generalized)  Localized edema  Rationale for Evaluation and Treatment: Rehabilitation  ONSET DATE: chronic   SUBJECTIVE:   SUBJECTIVE STATEMENT: "It feels pretty good. No pain this morning."   PERTINENT HISTORY: None  PAIN:  Are you having pain? None currently Yes: NPRS scale: at worst 0/10 Pain location: bilateral anterior knees Pain description: deep,stinging Aggravating factors: sidelying with knees touching, squatting, stairs Relieving factors: massage, heat, ice   PRECAUTIONS: None  WEIGHT BEARING RESTRICTIONS: No  FALLS:  Has patient fallen in last 6 months? No  LIVING ENVIRONMENT: Lives with: lives with their daughter Lives in: House/apartment Stairs: No Has following equipment at home: None  OCCUPATION: part-time transportation/traffic attendant   PLOF: Independent  PATIENT GOALS: "I want to get better and find out what's going on."  NEXT MD VISIT: nothing scheduled   OBJECTIVE:   DIAGNOSTIC FINDINGS:   4 views of bilateral knee x-rays including standing AP, Rosenberg,  lateral  and sunrise views were ordered and reviewed by myself.  X-rays demonstrate  very mild medial joint space narrowing bilaterally, but certainly no  advanced arthritic change in all 3 compartments.  No acute bony fracture.   MRI impression: IMPRESSION: 1. Intrasubstance degeneration within the majority of the posterior horn and posterior 50% of the body of the medial meniscus. No distinct tear is seen extending through an articular surface of the medial meniscus. 2. Mild-to-moderate attenuation of the anterior horn of the lateral meniscus suggesting chronic age related/degenerative change. No distinct tear is seen. 3. Very mild tricompartmental cartilage degenerative changes.   PATIENT SURVEYS:  FOTO 47% function to 59% predicted  COGNITION: Overall cognitive status: Within functional limits for tasks assessed     SENSATION: Not tested  EDEMA:  Mild swelling about Rt knee   MUSCLE LENGTH: Hamstrings: WNL bilaterally    POSTURE: genu varum   PALPATION: Diffuse tenderness about Rt anteromedial knee and IT band, Lt IT band and fibular head, bicep femoris   LOWER EXTREMITY ROM:  Active ROM Right eval Left eval 06/10/23 06/17/23  Hip flexion      Hip extension      Hip abduction      Hip adduction      Hip internal rotation      Hip external rotation      Knee flexion 138  137    Knee extension      Ankle dorsiflexion 2 3 Rt: 8; Lt: 4  Lt: 12; Rt: 10  Ankle plantarflexion      Ankle inversion      Ankle eversion       (Blank rows = not tested)  LOWER EXTREMITY MMT:  MMT Right eval Left eval Right 06/24/23 Left  06/24/23  Hip flexion 4- 4- 4 4  Hip extension 4- 4    Hip abduction 4- 4+    Hip adduction      Hip internal rotation      Hip external rotation      Knee flexion 4- pn 4- 4+ pn 4+  Knee extension 4- 4- 4+ 4+  Ankle dorsiflexion      Ankle plantarflexion      Ankle inversion      Ankle eversion       (Blank rows = not tested)  LOWER  EXTREMITY SPECIAL TESTS:  Valgus (-) Varus (-) McMurray's (-) Anterior Drawer (-) Posterior Drawer (-) Ober's (-) Thessaley's (-) Apley's compression/distraction (-)   FUNCTIONAL TESTS:  5 x STS: 25.6 seconds  SLS: Lt 13 seconds; Rt 15 seconds   06/17/23: 5 x STS: 16 seconds   GAIT: Distance walked: 10 ft Assistive device utilized: None Level of assistance: Complete Independence Comments: WNL [facility] Adult PT Treatment:                                                DATE: 06/24/23 Therapeutic Exercise: Nustep L6 x 5 min UE/LE  Leg press 20# x 10, 30# x 10 Seated LAQ 3# 2 x 10 SLR 1# 10 x 3  Hip bridge with blue band 10 x 2  Sidelying hip abduction     [facility] Adult PT Treatment:                                                DATE: 06/17/23 Therapeutic Exercise: NuStep level 5 x 5 minutes UE/LE  Hip bridge with abduction blue band 2 x 10  SLR 2 x 10 @ 1 lb  LAQ 2 x 10 @ 2 lb  Leg press 2 x 10 @ 20 lbs  Manual Therapy: STM Lt quadriceps Skilled palpation and monitoring before/during TPDN  Trigger Point Dry Needling Treatment: Pre-treatment instruction: Patient instructed on dry needling rationale, procedures, and possible side effects including pain during treatment (achy,cramping feeling), bruising, drop of blood, lightheadedness, nausea, sweating. Patient Consent Given: Yes Education handout provided: Yes Muscles treated: Lt vastus lateralis, intermedius; rectus femoris   Needle size and number: .30x61mm x 1 Electrical stimulation performed: No Parameters: N/A Treatment response/outcome: Twitch response elicited and Palpable decrease in muscle tension Post-treatment instructions: Patient instructed to expect possible mild to moderate muscle soreness later today and/or tomorrow. Patient instructed in methods to reduce muscle soreness and to continue prescribed HEP. If patient was dry needled over the lung field, patient was instructed on signs and symptoms of pneumothorax  and, however unlikely, to see immediate medical attention should they occur. Patient was also educated on signs and symptoms of infection and to seek medical attention should they occur. Patient verbalized understanding of these instructions and education.    [facility] Adult PT Treatment:  DATE: 06/10/23 Therapeutic Exercise: NuStep level 6 x 5 minutes UE/LE  Hip bridge 2 x 10  SLR 2 x 10  Sidelying hip abduction 2 x 10  LAQ 2 x 10 @ 1 lb  Sit to stand 2 x 10  Updated HEP    [facility] Adult PT Treatment:                                                DATE: 06/04/23 Therapeutic Exercise: Nustep L 6 UE and LE for 5 min Hamstring stretch 30 sec x 2 and then lateral ITB  Supine Bridge x 10  Single leg figure 4 bridge x 10  Sidelying Hip Abduction  2 x 15  Small Range Straight Leg Raise  2 x 15 added VMO  Ball squeeze with knee extension x 10 each  Hip abduction x 15  Modalities: Cold pack 10 min   PATIENT EDUCATION:  Education details: see treatment  Person educated: Patient Education method: Explanation and Handouts Education comprehension: verbalized understanding  HOME EXERCISE PROGRAM: Access Code: [facility] URL: [email or web address] Date: 05/20/2023 Prepared by: Letitia Libra  Exercises - Supine Bridge  - 1 x daily - 7 x weekly - 2 sets - 10 reps - Sidelying Hip Abduction  - 1 x daily - 7 x weekly - 2 sets - 10 reps - Small Range Straight Leg Raise  - 1 x daily - 7 x weekly - 2 sets - 10 reps - Gastroc Stretch on Wall  - 1 x daily - 7 x weekly - 3 sets - 30 sec  hold  ASSESSMENT:  CLINICAL IMPRESSION: Pt reports no pain on arrival. She reports TPDN performed last session relaxed the muscles in her left thigh. Her hip and knee strength has improved. She is progressing toward LTGs. Continued with hip and knee strengthening per PT POC. She had min increased right knee pain with L6 Nustep which resolved after reducing the  resistance. Repeated resistance exercises with increased resistance/reps with good tolerance.  No reports of pain at conclusion of session.   OBJECTIVE IMPAIRMENTS: decreased activity tolerance, decreased balance, difficulty walking, decreased ROM, decreased strength, impaired flexibility, improper body mechanics, postural dysfunction, and pain.   ACTIVITY LIMITATIONS: carrying, lifting, bending, standing, squatting, sleeping, stairs, transfers, and locomotion level  PARTICIPATION LIMITATIONS: meal prep, cleaning, laundry, shopping, community activity, occupation, and yard work  PERSONAL FACTORS: Age, Fitness, Past/current experiences, Profession, and Time since onset of injury/illness/exacerbation are also affecting patient's functional outcome.   REHAB POTENTIAL: Good  CLINICAL DECISION MAKING: Stable/uncomplicated  EVALUATION COMPLEXITY: Low   GOALS: Goals reviewed with patient? Yes  SHORT TERM GOALS: Target date: 06/17/2023   Patient will be independent and compliant with initial HEP.   Baseline: issued at eval  Goal status: met  2.  Patient will demonstrate at least 10 degrees of bilateral ankle DF AROM to reduce stress on her knees with closed chain activity.  Baseline: see above  Goal status: met  3.  Patient will complete 5 x STS in </=18 seconds to improve her functional strength and efficiency with transfers.  Baseline: see above  Goal status: met   LONG TERM GOALS: Target date: 07/19/23  Patient will score at least 59% on FOTO to signify clinically meaningful improvement in functional abilities.   Baseline: see above  Goal status: INITIAL  2.  Patient will  demonstrate 5/5 bilateral knee strength to improve stability with stair negotiation.  Baseline: see above  Goal status: INITIAL  3.  Patient will demonstrate 4+/5 bilateral hip strength to improve stability about the chain with prolonged walking/standing activity.  Baseline: see above Goal status:  INITIAL  4.  Patient will report pain at worst rated as </=7/10 to reduce her current functional limitations.  Baseline: see above  Goal status: INITIAL    PLAN:  PT FREQUENCY: 1x/week  PT DURATION: 8 weeks  PLANNED INTERVENTIONS: Therapeutic exercises, Therapeutic activity, Neuromuscular re-education, Balance training, Gait training, Patient/Family education, Self Care, Joint mobilization, Aquatic Therapy, Dry Needling, Electrical stimulation, Cryotherapy, Moist heat, Taping, Vasopneumatic device, Ionotophoresis 4mg /ml Dexamethasone, Manual therapy, and Re-evaluation  PLAN FOR NEXT SESSION: review and progress HEP prn; hip/knee strengthening, [place] Go, PTA 06/24/23 9:58 AM Phone: [phone] Fax: [phone]

## 2023-07-01 ENCOUNTER — Ambulatory Visit: Payer: Medicaid Other

## 2023-07-09 ENCOUNTER — Ambulatory Visit: Payer: Medicaid Other | Attending: Sports Medicine

## 2023-07-09 DIAGNOSIS — R600 Localized edema: Secondary | ICD-10-CM | POA: Insufficient documentation

## 2023-07-09 DIAGNOSIS — M6281 Muscle weakness (generalized): Secondary | ICD-10-CM | POA: Diagnosis not present

## 2023-07-09 DIAGNOSIS — G8929 Other chronic pain: Secondary | ICD-10-CM | POA: Insufficient documentation

## 2023-07-09 DIAGNOSIS — M25561 Pain in right knee: Secondary | ICD-10-CM | POA: Diagnosis not present

## 2023-07-09 DIAGNOSIS — M25562 Pain in left knee: Secondary | ICD-10-CM | POA: Diagnosis not present
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
## 2023-07-10 ENCOUNTER — Encounter: Payer: Self-pay | Admitting: Sports Medicine

## 2023-07-10 ENCOUNTER — Ambulatory Visit (INDEPENDENT_AMBULATORY_CARE_PROVIDER_SITE_OTHER): Payer: Medicaid Other | Admitting: Sports Medicine

## 2023-07-10 DIAGNOSIS — M23306 Other meniscus derangements, unspecified meniscus, right knee: Secondary | ICD-10-CM

## 2023-07-10 DIAGNOSIS — M25552 Pain in left hip: Secondary | ICD-10-CM

## 2023-07-10 DIAGNOSIS — M170 Bilateral primary osteoarthritis of knee: Secondary | ICD-10-CM

## 2023-07-10 DIAGNOSIS — M25551 Pain in right hip: Secondary | ICD-10-CM | POA: Diagnosis not present
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
## 2023-07-10 NOTE — Progress Notes (Signed)
Right knee is doing better; finished PT yesterday  She is now saying that the left is starting to act up  Patient was instructed in 10 minutes of therapeutic exercises for bilateral hips to improve strength, ROM and function according to my instructions and plan of care by a Certified Athletic Trainer during the office visit. A customized handout was provided and demonstration of proper technique shown and discussed. Patient did perform exercises and demonstrate understanding through teachback.  All questions discussed and answered.

## 2023-10-14 DIAGNOSIS — R928 Other abnormal and inconclusive findings on diagnostic imaging of breast: Secondary | ICD-10-CM | POA: Diagnosis not present

## 2023-10-14 LAB — HM MAMMOGRAPHY

## 2023-10-15 ENCOUNTER — Encounter: Payer: Self-pay | Admitting: Internal Medicine

## 2023-11-21 DIAGNOSIS — H524 Presbyopia: Secondary | ICD-10-CM | POA: Diagnosis not present

## 2024-04-16 ENCOUNTER — Encounter: Payer: Self-pay | Admitting: Internal Medicine

## 2024-04-16 ENCOUNTER — Ambulatory Visit: Admitting: Family Medicine

## 2024-04-16 ENCOUNTER — Encounter: Payer: Self-pay | Admitting: Family Medicine

## 2024-04-16 VITALS — BP 98/68 | HR 80 | Temp 98.3°F | Ht 61.0 in | Wt 121.8 lb

## 2024-04-16 DIAGNOSIS — Z0000 Encounter for general adult medical examination without abnormal findings: Secondary | ICD-10-CM | POA: Diagnosis not present

## 2024-04-16 DIAGNOSIS — E782 Mixed hyperlipidemia: Secondary | ICD-10-CM

## 2024-04-16 DIAGNOSIS — D72819 Decreased white blood cell count, unspecified: Secondary | ICD-10-CM

## 2024-04-16 DIAGNOSIS — E559 Vitamin D deficiency, unspecified: Secondary | ICD-10-CM | POA: Diagnosis not present

## 2024-04-16 DIAGNOSIS — M25519 Pain in unspecified shoulder: Secondary | ICD-10-CM | POA: Diagnosis not present

## 2024-04-16 MED ORDER — DICLOFENAC SODIUM 1 % EX GEL: 2.0000 g | Freq: Three times a day (TID) | CUTANEOUS | 1 refills | Status: AC | PRN
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
## 2024-04-16 NOTE — Patient Instructions (Signed)

## 2024-04-17 LAB — CMP14+EGFR
ALT: 11 IU/L (ref 0–32)
AST: 20 IU/L (ref 0–40)
Albumin: 4.5 g/dL (ref 3.8–4.9)
Alkaline Phosphatase: 83 IU/L (ref 44–121)
BUN/Creatinine Ratio: 13 (ref 9–23)
BUN: 10 mg/dL (ref 6–24)
Bilirubin Total: 1.4 mg/dL — ABNORMAL HIGH (ref 0.0–1.2)
CO2: 23 mmol/L (ref 20–29)
Calcium: 9.9 mg/dL (ref 8.7–10.2)
Chloride: 103 mmol/L (ref 96–106)
Creatinine, Ser: 0.8 mg/dL (ref 0.57–1.00)
Globulin, Total: 2.3 g/dL (ref 1.5–4.5)
Glucose: 88 mg/dL (ref 70–99)
Potassium: 4.3 mmol/L (ref 3.5–5.2)
Sodium: 140 mmol/L (ref 134–144)
Total Protein: 6.8 g/dL (ref 6.0–8.5)
eGFR: 86 mL/min/{1.73_m2} (ref >59)

## 2024-04-17 LAB — CBC
Hematocrit: 34.9 % (ref 34.0–46.6)
Hemoglobin: 11.4 g/dL (ref 11.1–15.9)
MCH: 29.3 pg (ref 26.6–33.0)
MCHC: 32.7 g/dL (ref 31.5–35.7)
MCV: 90 fL (ref 79–97)
Platelets: 180 10*3/uL (ref 150–450)
RBC: 3.89 x10E6/uL (ref 3.77–5.28)
RDW: 12.6 % (ref 11.7–15.4)
WBC: 2.2 10*3/uL — CL (ref 3.4–10.8)

## 2024-04-17 LAB — LIPID PANEL
Chol/HDL Ratio: 3.8 ratio (ref 0.0–4.4)
Cholesterol, Total: 222 mg/dL — ABNORMAL HIGH (ref 100–199)
HDL: 58 mg/dL (ref >39)
LDL Chol Calc (NIH): 150 mg/dL — ABNORMAL HIGH (ref 0–99)
Triglycerides: 78 mg/dL (ref 0–149)
VLDL Cholesterol Cal: 14 mg/dL (ref 5–40)

## 2024-04-17 LAB — VITAMIN D 25 HYDROXY (VIT D DEFICIENCY, FRACTURES): Vit D, 25-Hydroxy: 30.9 ng/mL (ref 30.0–100.0)

## 2024-04-20 ENCOUNTER — Encounter: Payer: Medicaid Other | Admitting: Internal Medicine

## 2024-04-21 ENCOUNTER — Encounter: Payer: Medicaid Other | Admitting: Internal Medicine

## 2024-04-21 ENCOUNTER — Encounter: Admitting: Family Medicine

## 2024-04-27 ENCOUNTER — Ambulatory Visit: Payer: Self-pay | Admitting: Family Medicine

## 2024-04-27 DIAGNOSIS — E559 Vitamin D deficiency, unspecified: Secondary | ICD-10-CM | POA: Insufficient documentation

## 2024-04-27 DIAGNOSIS — D72819 Decreased white blood cell count, unspecified: Secondary | ICD-10-CM | POA: Insufficient documentation

## 2024-04-27 MED ORDER — VITAMIN D 50 MCG (2000 UT) PO TABS: 2000.0000 [IU] | ORAL_TABLET | Freq: Every day | ORAL | 1 refills | Status: AC

## 2024-04-27 MED ORDER — ATORVASTATIN CALCIUM 20 MG PO TABS: 20.0000 mg | ORAL_TABLET | Freq: Every day | ORAL | 5 refills | Status: AC

## 2024-04-27 NOTE — Progress Notes (Signed)
 I just called you but no response. Your WBCs are low and it has been that way since about 2 years ago or more. I do not see a hematology referral in your chart, so I will refer you to one. Also you cholesterol levels are elevated, no statin therapy in your chart, so I will send some cholesterol medicine to the pharmacy for you.  Kidney function and electrolytes are normal. Vitamin D  is normal(barely). Take Vitamin D , 2000 units once daily.   Thanks

## 2024-05-06 ENCOUNTER — Ambulatory Visit: Admitting: Physician Assistant

## 2024-05-07 ENCOUNTER — Encounter: Payer: Self-pay | Admitting: Physician Assistant

## 2024-05-07 ENCOUNTER — Ambulatory Visit: Admitting: Physician Assistant

## 2024-05-07 ENCOUNTER — Other Ambulatory Visit (INDEPENDENT_AMBULATORY_CARE_PROVIDER_SITE_OTHER): Payer: Self-pay

## 2024-05-07 DIAGNOSIS — M7711 Lateral epicondylitis, right elbow: Secondary | ICD-10-CM | POA: Insufficient documentation

## 2024-05-07 DIAGNOSIS — M79601 Pain in right arm: Secondary | ICD-10-CM

## 2024-05-07 MED ORDER — MELOXICAM 7.5 MG PO TABS: 7.5000 mg | ORAL_TABLET | Freq: Every day | ORAL | 0 refills | Status: AC
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
## 2024-05-18 ENCOUNTER — Encounter: Payer: Self-pay | Admitting: Family Medicine

## 2024-05-27 ENCOUNTER — Ambulatory Visit: Attending: Physician Assistant | Admitting: Physical Therapy

## 2024-05-27 ENCOUNTER — Encounter: Payer: Self-pay | Admitting: Physical Therapy

## 2024-05-27 DIAGNOSIS — M6281 Muscle weakness (generalized): Secondary | ICD-10-CM | POA: Diagnosis not present

## 2024-05-27 DIAGNOSIS — M79601 Pain in right arm: Secondary | ICD-10-CM | POA: Diagnosis not present
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
## 2024-05-28 ENCOUNTER — Ambulatory Visit (INDEPENDENT_AMBULATORY_CARE_PROVIDER_SITE_OTHER): Admitting: Physician Assistant

## 2024-05-28 ENCOUNTER — Encounter: Payer: Self-pay | Admitting: Physician Assistant

## 2024-05-28 DIAGNOSIS — M7711 Lateral epicondylitis, right elbow: Secondary | ICD-10-CM | POA: Diagnosis not present

## 2024-05-28 NOTE — Progress Notes (Signed)
 Office Visit Note   Patient: Sydney  RAVINA Wiggins           Date of Birth: 04/12/1966           MRN: [id number] Visit Date: 05/28/2024              Requested by: Jarold Medici, MD [address] [place],  [place] [address] PCP: Jarold Medici, MD      HPI: Patient presents today follow-up on her lateral epicondylitis of her elbow.  She is only done 1 evaluation with PT but she does have more sessions planned.  It is about the same does bother her quite a bit she is wearing the forearm strap.  She did get some long-term relief from meloxicam   Assessment & Plan: Visit Diagnoses:  1. Lateral epicondylitis, right elbow     Plan: We debated an injection today.  After discussion she would like to continue to work with PT I told her it is fine to take the meloxicam  once a day and certainly that seemed to help her can follow-up at any time she wants an injection she can call us  and we will get her in  Follow-Up Instructions: Return if symptoms worsen or fail to improve.   Ortho Exam  Patient is alert, oriented, no adenopathy, well-dressed, normal affect, normal respiratory effort. Right elbow no swelling no erythema good range of motion she is focally tender over the lateral epicondyle    Imaging: No results found. No images are attached to the encounter.  Labs: Lab Results  Component Value Date   HGBA1C 5.5 03/30/2020   HGBA1C 5.6 10/14/2019     Lab Results  Component Value Date   ALBUMIN 4.5 04/16/2024   ALBUMIN 4.7 04/16/2023   ALBUMIN 4.7 04/08/2022    No results found for: MG Lab Results  Component Value Date   VD25OH 30.9 04/16/2024   VD25OH 26.6 (L) 04/08/2022    No results found for: PREALBUMIN    Latest Ref Rng & Units 04/16/2024   10:59 AM 04/16/2023   11:16 AM 07/04/2022   11:07 AM  CBC EXTENDED  WBC 3.4 - 10.8 x10E3/uL 2.2  4.0  2.8   RBC 3.77 - 5.28 x10E6/uL 3.89  3.94  3.48   Hemoglobin 11.1 - 15.9 g/dL 88.5  88.5  89.7   HCT 34.0 -  46.6 % 34.9  34.4  30.4   Platelets 150 - 450 x10E3/uL 180  182  176      There is no height or weight on file to calculate BMI.  Orders:  No orders of the defined types were placed in this encounter.  No orders of the defined types were placed in this encounter.    Procedures: No procedures performed  Clinical Data: No additional findings.  ROS:  All other systems negative, except as noted in the HPI. Review of Systems  Objective: Vital Signs: LMP  (LMP Unknown)   Specialty Comments:  No specialty comments available.  PMFS History: Patient Active Problem List   Diagnosis Date Noted   Lateral epicondylitis, right elbow 05/07/2024   Vitamin D  deficiency 04/27/2024   Leukopenia 04/27/2024   Encounter for general adult medical examination w/o abnormal findings 04/16/2024   Anterior shoulder pain 04/16/2024   Mixed hyperlipidemia 04/16/2024   Past Medical History:  Diagnosis Date   Iron deficiency anemia     Family History  Problem Relation Age of Onset   Healthy Mother    Stroke Mother  Prostate cancer Father    Heart attack Father     Past Surgical History:  Procedure Laterality Date   CESAREAN SECTION  1993   EXCISION OF TONGUE LESION  2005   Social History   Occupational History   Not on file  Tobacco Use   Smoking status: Never   Smokeless tobacco: Never  Vaping Use   Vaping status: Never Used  Substance and Sexual Activity   Alcohol use: No   Drug use: Never   Sexual activity: Not Currently    Partners: Male

## 2024-06-01 ENCOUNTER — Encounter: Payer: Self-pay | Admitting: Physical Therapy

## 2024-06-01 ENCOUNTER — Ambulatory Visit: Payer: Self-pay | Attending: Physician Assistant | Admitting: Physical Therapy

## 2024-06-01 DIAGNOSIS — G8929 Other chronic pain: Secondary | ICD-10-CM | POA: Insufficient documentation

## 2024-06-01 DIAGNOSIS — M79601 Pain in right arm: Secondary | ICD-10-CM | POA: Insufficient documentation

## 2024-06-01 DIAGNOSIS — M6281 Muscle weakness (generalized): Secondary | ICD-10-CM | POA: Diagnosis not present

## 2024-06-01 DIAGNOSIS — M25561 Pain in right knee: Secondary | ICD-10-CM | POA: Diagnosis not present

## 2024-06-01 DIAGNOSIS — M25562 Pain in left knee: Secondary | ICD-10-CM | POA: Diagnosis not present

## 2024-06-01 DIAGNOSIS — R600 Localized edema: Secondary | ICD-10-CM | POA: Insufficient documentation
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
## 2024-06-09 ENCOUNTER — Ambulatory Visit

## 2024-06-09 DIAGNOSIS — M6281 Muscle weakness (generalized): Secondary | ICD-10-CM

## 2024-06-09 DIAGNOSIS — R600 Localized edema: Secondary | ICD-10-CM

## 2024-06-09 DIAGNOSIS — M79601 Pain in right arm: Secondary | ICD-10-CM | POA: Diagnosis not present

## 2024-06-09 DIAGNOSIS — G8929 Other chronic pain: Secondary | ICD-10-CM

## 2024-06-09 DIAGNOSIS — M25561 Pain in right knee: Secondary | ICD-10-CM | POA: Diagnosis not present

## 2024-06-09 DIAGNOSIS — M25562 Pain in left knee: Secondary | ICD-10-CM | POA: Diagnosis not present

## 2024-06-11 ENCOUNTER — Encounter: Admitting: Physician Assistant

## 2024-06-11 ENCOUNTER — Other Ambulatory Visit

## 2024-06-15 ENCOUNTER — Ambulatory Visit: Admitting: Physical Therapy

## 2024-06-15 ENCOUNTER — Encounter: Payer: Self-pay | Admitting: Physical Therapy

## 2024-06-15 DIAGNOSIS — M6281 Muscle weakness (generalized): Secondary | ICD-10-CM | POA: Diagnosis not present

## 2024-06-15 DIAGNOSIS — R600 Localized edema: Secondary | ICD-10-CM | POA: Diagnosis not present

## 2024-06-15 DIAGNOSIS — M79601 Pain in right arm: Secondary | ICD-10-CM | POA: Diagnosis not present

## 2024-06-15 DIAGNOSIS — M25562 Pain in left knee: Secondary | ICD-10-CM | POA: Diagnosis not present

## 2024-06-15 DIAGNOSIS — G8929 Other chronic pain: Secondary | ICD-10-CM

## 2024-06-15 DIAGNOSIS — M25561 Pain in right knee: Secondary | ICD-10-CM | POA: Diagnosis not present
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
## 2024-06-17 ENCOUNTER — Ambulatory Visit

## 2024-06-17 DIAGNOSIS — G8929 Other chronic pain: Secondary | ICD-10-CM

## 2024-06-17 DIAGNOSIS — M25561 Pain in right knee: Secondary | ICD-10-CM | POA: Diagnosis not present

## 2024-06-17 DIAGNOSIS — M79601 Pain in right arm: Secondary | ICD-10-CM | POA: Diagnosis not present

## 2024-06-17 DIAGNOSIS — R600 Localized edema: Secondary | ICD-10-CM | POA: Diagnosis not present

## 2024-06-17 DIAGNOSIS — M6281 Muscle weakness (generalized): Secondary | ICD-10-CM | POA: Diagnosis not present

## 2024-06-17 DIAGNOSIS — M25562 Pain in left knee: Secondary | ICD-10-CM | POA: Diagnosis not present

## 2024-06-18 ENCOUNTER — Encounter

## 2024-06-24 ENCOUNTER — Ambulatory Visit: Admitting: Physical Therapy
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
## 2024-06-25 ENCOUNTER — Ambulatory Visit

## 2024-06-25 DIAGNOSIS — M6281 Muscle weakness (generalized): Secondary | ICD-10-CM

## 2024-06-25 DIAGNOSIS — M25562 Pain in left knee: Secondary | ICD-10-CM | POA: Diagnosis not present

## 2024-06-25 DIAGNOSIS — M79601 Pain in right arm: Secondary | ICD-10-CM

## 2024-06-25 DIAGNOSIS — R600 Localized edema: Secondary | ICD-10-CM | POA: Diagnosis not present

## 2024-06-25 DIAGNOSIS — G8929 Other chronic pain: Secondary | ICD-10-CM

## 2024-06-25 DIAGNOSIS — M25561 Pain in right knee: Secondary | ICD-10-CM | POA: Diagnosis not present

## 2024-06-27 NOTE — Progress Notes (Deleted)
 [facility] [phone]   [phone]  CONSULT NOTE  REFERRING PHYSICIAN: Bruna Creighton NP   REASON FOR CONSULTATION:  Leukopenia   HPI Sydney  C Wiggins is a 58 y.o. female with a past medical history significant for vitamin D  deficiency, shoulder pain, and *** is referred to the clinic for leukopenia.   The patient saw her PCP on 04/16/24 for an annual exam. Her lab work showed worsening leukopenia with a WBC of 2.2. Her Hbg was normal at 11.4 and her platelet count was normal at 180.   Per chart review, she had had intermittent leukopenia in the past and her WBC typically ranges in the 2.2-4 area. She also had mild anemia in 2023. She was referred to the clinic for further evaluation of these findings.   The oldest labs available to me ar from 2016. Her WBC has been low intermittently since 2020.    The patient denies any known liver disease such as hepatitis, NAFLD, or cirrhosis. She denies splenic disorders.  For alcohol use, she estimates she drinks approximately *** drinks per week . ***weight loss.  ***vitamin deficiency.  She denies history of bariatric surgery. For medication, she takes ***.  She denies any herbal supplements. She denies abnormal bleeding, bruising, or bleeding complications from surgery. Denies any history of HIV. Denies any known tick borne illness. Denies fevers, chills, night sweats, or unexplained weight loss. Denies lymphadenopathy.    Regarding the leukopenia, this has been going on for approximately ***  to the patients knowledge.  The patient denies frequent illness.  Regarding fatigue, the patient is***..   She denies personal history of autoimmune disorder or inflammatory disorder. She denies abdominal bloating or early satiety.   HPI  Past Medical History:  Diagnosis Date   Iron deficiency anemia     Past Surgical History:  Procedure Laterality Date   CESAREAN SECTION  1993   EXCISION OF TONGUE LESION  2005     Family History  Problem Relation Age of Onset   Healthy Mother    Stroke Mother    Prostate cancer Father    Heart attack Father     Social History Social History   Tobacco Use   Smoking status: Never   Smokeless tobacco: Never  Vaping Use   Vaping status: Never Used  Substance Use Topics   Alcohol use: No   Drug use: Never    No Known Allergies  Current Outpatient Medications  Medication Sig Dispense Refill   atorvastatin  (LIPITOR) 20 MG tablet Take 1 tablet (20 mg total) by mouth daily. 30 tablet 5   Cholecalciferol (VITAMIN D ) 50 MCG (2000 UT) tablet Take 1 tablet (2,000 Units total) by mouth daily. 90 tablet 1   cyanocobalamin 100 MCG tablet Take by mouth. (Patient not taking: Reported on 05/27/2024)     diclofenac  Sodium (VOLTAREN ) 1 % GEL Apply 2 g topically 3 (three) times daily as needed. 50 g 1   ferrous sulfate 325 (65 FE) MG tablet Take by mouth.     MAGNESIUM PO Take by mouth. 435 mg daily     meloxicam  (MOBIC ) 7.5 MG tablet Take 1 tablet (7.5 mg total) by mouth daily. 30 tablet 0   No current facility-administered medications for this visit.    REVIEW OF SYSTEMS:   Review of Systems  Constitutional: Negative for appetite change, chills, fatigue, fever and unexpected weight change.  HENT:   Negative for mouth sores, nosebleeds, sore throat and trouble  swallowing.   Eyes: Negative for eye problems and icterus.  Respiratory: Negative for cough, hemoptysis, shortness of breath and wheezing.   Cardiovascular: Negative for chest pain and leg swelling.  Gastrointestinal: Negative for abdominal pain, constipation, diarrhea, nausea and vomiting.  Genitourinary: Negative for bladder incontinence, difficulty urinating, dysuria, frequency and hematuria.   Musculoskeletal: Negative for back pain, gait problem, neck pain and neck stiffness.  Skin: Negative for itching and rash.  Neurological: Negative for dizziness, extremity weakness, gait problem, headaches,  light-headedness and seizures.  Hematological: Negative for adenopathy. Does not bruise/bleed easily.  Psychiatric/Behavioral: Negative for confusion, depression and sleep disturbance. The patient is not nervous/anxious.     PHYSICAL EXAMINATION:  There were no vitals taken for this visit.  ECOG PERFORMANCE STATUS: {CHL ONC ECOG [id number]  Physical Exam  Constitutional: Oriented to person, place, and time and well-developed, well-nourished, and in no distress. No distress.  HENT:  Head: Normocephalic and atraumatic.  Mouth/Throat: Oropharynx is clear and moist. No oropharyngeal exudate.  Eyes: Conjunctivae are normal. Right eye exhibits no discharge. Left eye exhibits no discharge. No scleral icterus.  Neck: Normal range of motion. Neck supple.  Cardiovascular: Normal rate, regular rhythm, normal heart sounds and intact distal pulses.   Pulmonary/Chest: Effort normal and breath sounds normal. No respiratory distress. No wheezes. No rales.  Abdominal: Soft. Bowel sounds are normal. Exhibits no distension and no mass. There is no tenderness.  Musculoskeletal: Normal range of motion. Exhibits no edema.  Lymphadenopathy:    No cervical adenopathy.  Neurological: Alert and oriented to person, place, and time. Exhibits normal muscle tone. Gait normal. Coordination normal.  Skin: Skin is warm and dry. No rash noted. Not diaphoretic. No erythema. No pallor.  Psychiatric: Mood, memory and judgment normal.  Vitals reviewed.  LABORATORY DATA: Lab Results  Component Value Date   WBC 2.2 (LL) 04/16/2024   HGB 11.4 04/16/2024   HCT 34.9 04/16/2024   MCV 90 04/16/2024   PLT 180 04/16/2024      Chemistry      Component Value Date/Time   NA 140 04/16/2024 1059   K 4.3 04/16/2024 1059   CL 103 04/16/2024 1059   CO2 23 04/16/2024 1059   BUN 10 04/16/2024 1059   CREATININE 0.80 04/16/2024 1059      Component Value Date/Time   CALCIUM  9.9 04/16/2024 1059   ALKPHOS 83 04/16/2024 1059    AST 20 04/16/2024 1059   ALT 11 04/16/2024 1059   BILITOT 1.4 (H) 04/16/2024 1059       RADIOGRAPHIC STUDIES: No results found.  ASSESSMENT: This is a very pleasant 58 year old with leukopenia.   The patient was seen with Dr. Sherrod today. Her labs today include CBC, CMP, LDH, B12, folate, ANA, RF, HIV, Hepatitis, and *** testing.   I will call the patient with the results.   Follow up ***  ***  The patient voices understanding of current disease status and treatment options and is in agreement with the current care plan.  All questions were answered. The patient knows to call the clinic with any problems, questions or concerns. We can certainly see the patient much sooner if necessary.  Thank you so much for allowing me to participate in the care of Sydney  C Wiggins. I will continue to follow up the patient with you and assist in her care.  I spent {CHL ONC TIME VISIT - DTPQU:[id number]} counseling the patient face to face. The total time spent in the appointment was {  CHL ONC TIME VISIT - [id number].  Disclaimer: This note was dictated with voice recognition software. Similar sounding words can inadvertently be transcribed and may not be corrected upon review.   Samer Dutton L Alvy Alsop June 27, 2024, 10:59 AM

## 2024-06-29 ENCOUNTER — Inpatient Hospital Stay: Attending: Physician Assistant | Admitting: Physician Assistant

## 2024-06-29 ENCOUNTER — Inpatient Hospital Stay

## 2024-06-29 ENCOUNTER — Other Ambulatory Visit: Payer: Self-pay | Admitting: Physician Assistant

## 2024-06-29 DIAGNOSIS — D72819 Decreased white blood cell count, unspecified: Secondary | ICD-10-CM
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
## 2024-07-01 ENCOUNTER — Ambulatory Visit

## 2024-07-01 DIAGNOSIS — M6281 Muscle weakness (generalized): Secondary | ICD-10-CM | POA: Diagnosis not present

## 2024-07-01 DIAGNOSIS — M79601 Pain in right arm: Secondary | ICD-10-CM

## 2024-07-01 DIAGNOSIS — M25562 Pain in left knee: Secondary | ICD-10-CM | POA: Diagnosis not present

## 2024-07-01 DIAGNOSIS — G8929 Other chronic pain: Secondary | ICD-10-CM | POA: Diagnosis not present

## 2024-07-01 DIAGNOSIS — R600 Localized edema: Secondary | ICD-10-CM | POA: Diagnosis not present

## 2024-07-01 DIAGNOSIS — M25561 Pain in right knee: Secondary | ICD-10-CM | POA: Diagnosis not present

## 2024-07-02 ENCOUNTER — Encounter

## 2024-07-08 ENCOUNTER — Encounter: Payer: Self-pay | Admitting: Physical Therapy

## 2024-07-08 ENCOUNTER — Ambulatory Visit: Attending: Physician Assistant | Admitting: Physical Therapy

## 2024-07-08 DIAGNOSIS — R600 Localized edema: Secondary | ICD-10-CM | POA: Insufficient documentation

## 2024-07-08 DIAGNOSIS — M25561 Pain in right knee: Secondary | ICD-10-CM | POA: Diagnosis not present

## 2024-07-08 DIAGNOSIS — M79601 Pain in right arm: Secondary | ICD-10-CM | POA: Insufficient documentation

## 2024-07-08 DIAGNOSIS — M25562 Pain in left knee: Secondary | ICD-10-CM | POA: Diagnosis not present

## 2024-07-08 DIAGNOSIS — G8929 Other chronic pain: Secondary | ICD-10-CM | POA: Insufficient documentation

## 2024-07-08 DIAGNOSIS — M6281 Muscle weakness (generalized): Secondary | ICD-10-CM | POA: Diagnosis not present
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
## 2024-07-09 ENCOUNTER — Ambulatory Visit: Admitting: Physical Therapy

## 2024-07-09 ENCOUNTER — Encounter: Payer: Self-pay | Admitting: Physical Therapy

## 2024-07-09 DIAGNOSIS — R600 Localized edema: Secondary | ICD-10-CM | POA: Diagnosis not present

## 2024-07-09 DIAGNOSIS — M6281 Muscle weakness (generalized): Secondary | ICD-10-CM | POA: Diagnosis not present

## 2024-07-09 DIAGNOSIS — M25561 Pain in right knee: Secondary | ICD-10-CM | POA: Diagnosis not present

## 2024-07-09 DIAGNOSIS — M25562 Pain in left knee: Secondary | ICD-10-CM | POA: Diagnosis not present

## 2024-07-09 DIAGNOSIS — G8929 Other chronic pain: Secondary | ICD-10-CM | POA: Diagnosis not present

## 2024-07-09 DIAGNOSIS — M79601 Pain in right arm: Secondary | ICD-10-CM | POA: Diagnosis not present
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
## 2024-07-22 ENCOUNTER — Ambulatory Visit: Admitting: Physical Therapy

## 2024-07-22 ENCOUNTER — Encounter: Payer: Self-pay | Admitting: Physical Therapy

## 2024-07-22 DIAGNOSIS — R600 Localized edema: Secondary | ICD-10-CM | POA: Diagnosis not present

## 2024-07-22 DIAGNOSIS — M25561 Pain in right knee: Secondary | ICD-10-CM | POA: Diagnosis not present

## 2024-07-22 DIAGNOSIS — M6281 Muscle weakness (generalized): Secondary | ICD-10-CM | POA: Diagnosis not present

## 2024-07-22 DIAGNOSIS — M79601 Pain in right arm: Secondary | ICD-10-CM

## 2024-07-22 DIAGNOSIS — M25562 Pain in left knee: Secondary | ICD-10-CM | POA: Diagnosis not present

## 2024-07-22 DIAGNOSIS — G8929 Other chronic pain: Secondary | ICD-10-CM | POA: Diagnosis not present
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
## 2024-07-23 ENCOUNTER — Ambulatory Visit

## 2024-07-23 DIAGNOSIS — R600 Localized edema: Secondary | ICD-10-CM

## 2024-07-23 DIAGNOSIS — M79601 Pain in right arm: Secondary | ICD-10-CM

## 2024-07-23 DIAGNOSIS — M6281 Muscle weakness (generalized): Secondary | ICD-10-CM

## 2024-07-23 DIAGNOSIS — G8929 Other chronic pain: Secondary | ICD-10-CM

## 2024-07-23 DIAGNOSIS — M25562 Pain in left knee: Secondary | ICD-10-CM | POA: Diagnosis not present

## 2024-07-23 DIAGNOSIS — M25561 Pain in right knee: Secondary | ICD-10-CM | POA: Diagnosis not present

## 2024-07-27 ENCOUNTER — Encounter: Admitting: Physical Therapy
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
## 2024-07-29 ENCOUNTER — Ambulatory Visit: Admitting: Physical Therapy

## 2024-07-29 ENCOUNTER — Encounter: Payer: Self-pay | Admitting: Physical Therapy

## 2024-07-29 DIAGNOSIS — G8929 Other chronic pain: Secondary | ICD-10-CM

## 2024-07-29 DIAGNOSIS — R600 Localized edema: Secondary | ICD-10-CM

## 2024-07-29 DIAGNOSIS — M79601 Pain in right arm: Secondary | ICD-10-CM | POA: Diagnosis not present

## 2024-07-29 DIAGNOSIS — M6281 Muscle weakness (generalized): Secondary | ICD-10-CM

## 2024-07-29 DIAGNOSIS — M25562 Pain in left knee: Secondary | ICD-10-CM | POA: Diagnosis not present

## 2024-07-29 DIAGNOSIS — M25561 Pain in right knee: Secondary | ICD-10-CM | POA: Diagnosis not present

## 2024-07-29 NOTE — Patient Instructions (Signed)
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
## 2024-08-13 ENCOUNTER — Ambulatory Visit: Payer: Self-pay | Attending: Physician Assistant

## 2024-08-13 DIAGNOSIS — M25561 Pain in right knee: Secondary | ICD-10-CM | POA: Insufficient documentation

## 2024-08-13 DIAGNOSIS — M6281 Muscle weakness (generalized): Secondary | ICD-10-CM | POA: Insufficient documentation

## 2024-08-13 DIAGNOSIS — R600 Localized edema: Secondary | ICD-10-CM | POA: Insufficient documentation

## 2024-08-13 DIAGNOSIS — G8929 Other chronic pain: Secondary | ICD-10-CM | POA: Insufficient documentation

## 2024-08-13 DIAGNOSIS — M25562 Pain in left knee: Secondary | ICD-10-CM | POA: Insufficient documentation

## 2024-08-13 DIAGNOSIS — M79601 Pain in right arm: Secondary | ICD-10-CM | POA: Diagnosis not present
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
## 2024-08-18 ENCOUNTER — Ambulatory Visit

## 2024-08-18 DIAGNOSIS — G8929 Other chronic pain: Secondary | ICD-10-CM | POA: Diagnosis not present

## 2024-08-18 DIAGNOSIS — M79601 Pain in right arm: Secondary | ICD-10-CM | POA: Diagnosis not present

## 2024-08-18 DIAGNOSIS — M6281 Muscle weakness (generalized): Secondary | ICD-10-CM

## 2024-08-18 DIAGNOSIS — M25562 Pain in left knee: Secondary | ICD-10-CM | POA: Diagnosis not present

## 2024-08-18 DIAGNOSIS — R600 Localized edema: Secondary | ICD-10-CM | POA: Diagnosis not present

## 2024-08-18 DIAGNOSIS — M25561 Pain in right knee: Secondary | ICD-10-CM | POA: Diagnosis not present

## 2024-08-19 ENCOUNTER — Ambulatory Visit

## 2024-08-19 DIAGNOSIS — R600 Localized edema: Secondary | ICD-10-CM | POA: Diagnosis not present

## 2024-08-19 DIAGNOSIS — M79601 Pain in right arm: Secondary | ICD-10-CM

## 2024-08-19 DIAGNOSIS — G8929 Other chronic pain: Secondary | ICD-10-CM | POA: Diagnosis not present

## 2024-08-19 DIAGNOSIS — M6281 Muscle weakness (generalized): Secondary | ICD-10-CM | POA: Diagnosis not present

## 2024-08-19 DIAGNOSIS — M25562 Pain in left knee: Secondary | ICD-10-CM | POA: Diagnosis not present

## 2024-08-19 DIAGNOSIS — M25561 Pain in right knee: Secondary | ICD-10-CM | POA: Diagnosis not present
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
## 2024-08-26 ENCOUNTER — Ambulatory Visit

## 2024-08-26 DIAGNOSIS — M25561 Pain in right knee: Secondary | ICD-10-CM | POA: Diagnosis not present

## 2024-08-26 DIAGNOSIS — G8929 Other chronic pain: Secondary | ICD-10-CM | POA: Diagnosis not present

## 2024-08-26 DIAGNOSIS — M79601 Pain in right arm: Secondary | ICD-10-CM

## 2024-08-26 DIAGNOSIS — M6281 Muscle weakness (generalized): Secondary | ICD-10-CM | POA: Diagnosis not present

## 2024-08-26 DIAGNOSIS — R600 Localized edema: Secondary | ICD-10-CM | POA: Diagnosis not present

## 2024-08-26 DIAGNOSIS — M25562 Pain in left knee: Secondary | ICD-10-CM | POA: Diagnosis not present

## 2024-08-27 ENCOUNTER — Ambulatory Visit: Admitting: Physical Therapy

## 2024-09-01 ENCOUNTER — Ambulatory Visit: Attending: Physician Assistant

## 2024-09-02 ENCOUNTER — Ambulatory Visit

## 2024-09-14 ENCOUNTER — Ambulatory Visit: Admitting: Internal Medicine

## 2024-09-14 ENCOUNTER — Encounter: Payer: Self-pay | Admitting: Internal Medicine

## 2024-09-14 ENCOUNTER — Ambulatory Visit: Payer: Self-pay | Admitting: Internal Medicine

## 2024-09-14 VITALS — BP 92/60 | HR 80 | Temp 98.5°F | Ht 61.0 in | Wt 126.0 lb

## 2024-09-14 DIAGNOSIS — B9689 Other specified bacterial agents as the cause of diseases classified elsewhere: Secondary | ICD-10-CM

## 2024-09-14 DIAGNOSIS — N760 Acute vaginitis: Secondary | ICD-10-CM | POA: Diagnosis not present

## 2024-09-14 DIAGNOSIS — N898 Other specified noninflammatory disorders of vagina: Secondary | ICD-10-CM

## 2024-09-14 LAB — POCT WET PREP (WET MOUNT)

## 2024-09-14 MED ORDER — METRONIDAZOLE 500 MG PO TABS: 500.0000 mg | ORAL_TABLET | Freq: Two times a day (BID) | ORAL | 0 refills | Status: AC

## 2024-09-14 NOTE — Progress Notes (Signed)
 Patient Care Team: Jarold Medici, MD as PCP - General (Internal Medicine) Carlene Ozell BRAVO., MD as Referring Physician (Obstetrics and Gynecology)  Visit Date: 09/14/24  Subjective:    Patient ID: Sydney  JAYSON Wiggins , Female   DOB: 12/10/1965, 58 y.o.    MRN: [id number]   58 y.o. Female presents today for vaginal discharge. Patient has a past medical history of pure hypercholesterolemia and Vitamin D  deficiency. She was given appt here today through the call center. Dr. Medici Jarold is her PCP.    For 3-4 days she said she has had thick, yellow vaginal discharge. She said discharge doesn't itch or have an odo.  She states she's had it intermittently. She is not sexually active and has not been since 2013.     Vaginal exam showed thick, white discharge. Wet prep showed the presence of clue cells. No dysuria,fever, chills, nausea or vomiting.   Past Medical History:  Diagnosis Date   Iron deficiency anemia      Family History  Problem Relation Age of Onset   Stroke Mother    Arthritis Mother    Prostate cancer Father    Heart attack Father    Cancer Father    Diabetes Father    Heart disease Father    Stroke Father      Review of Systems  Genitourinary:        Thick, white vaginal discharge         Objective:   Vitals: BP 92/60   Pulse 80   Temp 98.5 F (36.9 C)   Ht 5' 1 (1.549 m)   Wt 126 lb (57.2 kg)   LMP  (LMP Unknown)   SpO2 98%   BMI 23.81 kg/m    Physical Exam Exam conducted with a chaperone present (Araceli Jagual, CMA).  Genitourinary:    Vagina: Vaginal discharge present.     Wet prep shows a few clue cells and bacteria.  Results:    Labs:       Component Value Date/Time   NA 140 04/16/2024 1059   K 4.3 04/16/2024 1059   CL 103 04/16/2024 1059   CO2 23 04/16/2024 1059   GLUCOSE 88 04/16/2024 1059   GLUCOSE 86 11/19/2015 1525   BUN 10 04/16/2024 1059   CREATININE 0.80 04/16/2024 1059   CALCIUM  9.9 04/16/2024 1059    PROT 6.8 04/16/2024 1059   ALBUMIN 4.5 04/16/2024 1059   AST 20 04/16/2024 1059   ALT 11 04/16/2024 1059   ALKPHOS 83 04/16/2024 1059   BILITOT 1.4 (H) 04/16/2024 1059   GFRNONAA 95 03/30/2020 0936   GFRAA 110 03/30/2020 0936     Lab Results  Component Value Date   WBC 2.2 (LL) 04/16/2024   HGB 11.4 04/16/2024   HCT 34.9 04/16/2024   MCV 90 04/16/2024   PLT 180 04/16/2024    Lab Results  Component Value Date   CHOL 222 (H) 04/16/2024   HDL 58 04/16/2024   LDLCALC 150 (H) 04/16/2024   TRIG 78 04/16/2024   CHOLHDL 3.8 04/16/2024    Lab Results  Component Value Date   HGBA1C 5.5 03/30/2020     Lab Results  Component Value Date   TSH 0.992 03/30/2020         Assessment & Plan:   Orders Placed This Encounter  Procedures   POCT Wet Prep (Wet [place])   Meds ordered this encounter  Medications   metroNIDAZOLE (FLAGYL) 500 MG tablet    Sig:  Take 1 tablet (500 mg total) by mouth 2 (two) times daily for 7 days.    Dispense:  14 tablet    Refill:  0    Bacterial Vaginosis: For 3-4 days she said she has had thick, yellow vaginal discharge. She said it doesn't itch or smell and she's had it intermittently. She is not sexually active and has not been since 2013.  Vaginal exam showed thick, white discharge. Wet prep showed the presence of clue cells.    Flagyl 500 mg twice daily for 7 days prescribed.  After Completion of Flagyl , she is to use a vinegar and water douche once.  Time spent explaining that sometimes there is an imbalance of either yeast or bacteria in vagina. This is not her fault but it might recur at some point. We expect Flagyl to resolve the problem. However, if she has further questions or concerns, she may reach out to Dr. Jarold or our office.     I,Makayla C Reid,acting as a scribe for Ronal JINNY Hailstone, MD.,have documented all relevant documentation on the behalf of Ronal JINNY Hailstone, MD,as directed by  Ronal JINNY Hailstone, MD while in the presence of Ronal JINNY Hailstone, MD.

## 2024-09-19 NOTE — Patient Instructions (Addendum)
 It was a pleasure to meet you today.  Examination of vaginal discharge shows that you have bacterial vaginosis.  We have prescribed Flagyl 500 mg by mouth twice daily for 7 days.  This is to be followed by vinegar and water douche 1 time.  We think this will resolve the problem but sometimes bacterial vaginosis can recur.  Please do not hesitate to reach out to Dr. Jarold if you have recurrent symptoms.

## 2024-10-04 ENCOUNTER — Encounter: Payer: Self-pay | Admitting: Radiology

## 2024-10-14 DIAGNOSIS — Z1231 Encounter for screening mammogram for malignant neoplasm of breast: Secondary | ICD-10-CM | POA: Diagnosis not present

## 2024-10-14 LAB — HM MAMMOGRAPHY

## 2024-10-19 ENCOUNTER — Encounter: Payer: Self-pay | Admitting: Internal Medicine
